# Patient Record
Sex: Male | Born: 1960 | Race: White | Hispanic: No | Marital: Married | Smoking: Never smoker
Health system: Southern US, Community
[De-identification: ages and names within clinical notes are randomized; demographics above are authoritative.]

## PROBLEM LIST (undated history)

## (undated) DIAGNOSIS — G4730 Sleep apnea, unspecified: Secondary | ICD-10-CM

---

## 2013-11-14 HISTORY — PX: OTHER SURGICAL HISTORY: SHX169

## 2013-11-18 ENCOUNTER — Other Ambulatory Visit: Payer: Self-pay | Admitting: Neurosurgery

## 2013-11-18 ENCOUNTER — Encounter (HOSPITAL_COMMUNITY): Payer: Self-pay

## 2013-11-21 ENCOUNTER — Other Ambulatory Visit (HOSPITAL_COMMUNITY): Payer: Self-pay | Admitting: Neurosurgery

## 2013-11-21 ENCOUNTER — Other Ambulatory Visit: Payer: Self-pay | Admitting: Neurosurgery

## 2013-11-21 DIAGNOSIS — D496 Neoplasm of unspecified behavior of brain: Secondary | ICD-10-CM

## 2013-11-26 ENCOUNTER — Inpatient Hospital Stay (HOSPITAL_COMMUNITY): Payer: BC Managed Care – PPO

## 2013-11-26 ENCOUNTER — Inpatient Hospital Stay (HOSPITAL_COMMUNITY)
Admission: RE | Admit: 2013-11-26 | Discharge: 2014-01-12 | DRG: 025 | Disposition: E | Payer: BC Managed Care – PPO | Source: Ambulatory Visit | Attending: Neurosurgery | Admitting: Neurosurgery

## 2013-11-26 DIAGNOSIS — E870 Hyperosmolality and hypernatremia: Secondary | ICD-10-CM | POA: Diagnosis not present

## 2013-11-26 DIAGNOSIS — Z66 Do not resuscitate: Secondary | ICD-10-CM | POA: Diagnosis present

## 2013-11-26 DIAGNOSIS — E876 Hypokalemia: Secondary | ICD-10-CM | POA: Diagnosis not present

## 2013-11-26 DIAGNOSIS — A419 Sepsis, unspecified organism: Secondary | ICD-10-CM | POA: Diagnosis not present

## 2013-11-26 DIAGNOSIS — Z515 Encounter for palliative care: Secondary | ICD-10-CM

## 2013-11-26 DIAGNOSIS — R531 Weakness: Secondary | ICD-10-CM

## 2013-11-26 DIAGNOSIS — R4182 Altered mental status, unspecified: Secondary | ICD-10-CM

## 2013-11-26 DIAGNOSIS — E162 Hypoglycemia, unspecified: Secondary | ICD-10-CM | POA: Diagnosis not present

## 2013-11-26 DIAGNOSIS — G9340 Encephalopathy, unspecified: Secondary | ICD-10-CM

## 2013-11-26 DIAGNOSIS — J189 Pneumonia, unspecified organism: Secondary | ICD-10-CM | POA: Diagnosis not present

## 2013-11-26 DIAGNOSIS — E871 Hypo-osmolality and hyponatremia: Secondary | ICD-10-CM | POA: Diagnosis not present

## 2013-11-26 DIAGNOSIS — I498 Other specified cardiac arrhythmias: Secondary | ICD-10-CM | POA: Diagnosis not present

## 2013-11-26 DIAGNOSIS — H02409 Unspecified ptosis of unspecified eyelid: Secondary | ICD-10-CM | POA: Diagnosis present

## 2013-11-26 DIAGNOSIS — I6350 Cerebral infarction due to unspecified occlusion or stenosis of unspecified cerebral artery: Secondary | ICD-10-CM | POA: Diagnosis present

## 2013-11-26 DIAGNOSIS — H4900 Third [oculomotor] nerve palsy, unspecified eye: Secondary | ICD-10-CM | POA: Diagnosis not present

## 2013-11-26 DIAGNOSIS — Z96659 Presence of unspecified artificial knee joint: Secondary | ICD-10-CM

## 2013-11-26 DIAGNOSIS — C715 Malignant neoplasm of cerebral ventricle: Principal | ICD-10-CM | POA: Diagnosis present

## 2013-11-26 DIAGNOSIS — R358 Other polyuria: Secondary | ICD-10-CM

## 2013-11-26 DIAGNOSIS — I2699 Other pulmonary embolism without acute cor pulmonale: Secondary | ICD-10-CM

## 2013-11-26 DIAGNOSIS — G911 Obstructive hydrocephalus: Secondary | ICD-10-CM | POA: Diagnosis not present

## 2013-11-26 DIAGNOSIS — T380X5A Adverse effect of glucocorticoids and synthetic analogues, initial encounter: Secondary | ICD-10-CM | POA: Diagnosis not present

## 2013-11-26 DIAGNOSIS — R739 Hyperglycemia, unspecified: Secondary | ICD-10-CM

## 2013-11-26 DIAGNOSIS — H519 Unspecified disorder of binocular movement: Secondary | ICD-10-CM | POA: Diagnosis not present

## 2013-11-26 DIAGNOSIS — R3589 Other polyuria: Secondary | ICD-10-CM

## 2013-11-26 DIAGNOSIS — D649 Anemia, unspecified: Secondary | ICD-10-CM | POA: Diagnosis present

## 2013-11-26 DIAGNOSIS — D496 Neoplasm of unspecified behavior of brain: Secondary | ICD-10-CM | POA: Diagnosis present

## 2013-11-26 DIAGNOSIS — G4733 Obstructive sleep apnea (adult) (pediatric): Secondary | ICD-10-CM | POA: Diagnosis present

## 2013-11-26 DIAGNOSIS — I2692 Saddle embolus of pulmonary artery without acute cor pulmonale: Secondary | ICD-10-CM | POA: Diagnosis not present

## 2013-11-26 DIAGNOSIS — R4020 Unspecified coma: Secondary | ICD-10-CM | POA: Diagnosis not present

## 2013-11-26 DIAGNOSIS — G8190 Hemiplegia, unspecified affecting unspecified side: Secondary | ICD-10-CM | POA: Diagnosis present

## 2013-11-26 DIAGNOSIS — J9600 Acute respiratory failure, unspecified whether with hypoxia or hypercapnia: Secondary | ICD-10-CM | POA: Diagnosis not present

## 2013-11-26 DIAGNOSIS — R471 Dysarthria and anarthria: Secondary | ICD-10-CM | POA: Diagnosis present

## 2013-11-26 DIAGNOSIS — R1310 Dysphagia, unspecified: Secondary | ICD-10-CM | POA: Diagnosis not present

## 2013-11-26 HISTORY — DX: Sleep apnea, unspecified: G47.30

## 2013-11-26 LAB — BASIC METABOLIC PANEL
BUN: 17 mg/dL (ref 6–23)
CALCIUM: 8.8 mg/dL (ref 8.4–10.5)
CO2: 25 meq/L (ref 19–32)
Chloride: 104 mEq/L (ref 96–112)
Creatinine, Ser: 1.01 mg/dL (ref 0.50–1.35)
GFR calc Af Amer: >90 mL/min (ref >90)
GFR calc non Af Amer: 84 mL/min — ABNORMAL LOW (ref >90)
GLUCOSE: 141 mg/dL — AB (ref 70–99)
POTASSIUM: 4.2 meq/L (ref 3.7–5.3)
Sodium: 141 mEq/L (ref 137–147)

## 2013-11-26 LAB — CBC
HCT: 37.1 % — ABNORMAL LOW (ref 39.0–52.0)
Hemoglobin: 12.7 g/dL — ABNORMAL LOW (ref 13.0–17.0)
MCH: 28.5 pg (ref 26.0–34.0)
MCHC: 34.2 g/dL (ref 30.0–36.0)
MCV: 83.2 fL (ref 78.0–100.0)
PLATELETS: 323 10*3/uL (ref 150–400)
RBC: 4.46 MIL/uL (ref 4.22–5.81)
RDW: 13.7 % (ref 11.5–15.5)
WBC: 9.2 10*3/uL (ref 4.0–10.5)

## 2013-11-26 LAB — APTT: aPTT: 29 seconds (ref 24–37)

## 2013-11-26 LAB — PROTIME-INR
INR: 0.97 (ref 0.00–1.49)
Prothrombin Time: 12.7 seconds (ref 11.6–15.2)

## 2013-11-26 MED ORDER — OMEGA-3-ACID ETHYL ESTERS 1 G PO CAPS
1.0000 g | ORAL_CAPSULE | Freq: Every day | ORAL | Status: DC
  Administered 2013-11-26: 1 g via ORAL
  Filled 2013-11-26 (×4): qty 1

## 2013-11-26 MED ORDER — SENNA 8.6 MG PO TABS
1.0000 | ORAL_TABLET | Freq: Two times a day (BID) | ORAL | Status: DC
  Administered 2013-11-26 – 2013-11-28 (×2): 8.6 mg via ORAL
  Filled 2013-11-26 (×7): qty 1

## 2013-11-26 MED ORDER — SODIUM CHLORIDE 0.9 % IV SOLN
INTRAVENOUS | Status: DC
  Administered 2013-11-26: 18:00:00 via INTRAVENOUS

## 2013-11-26 MED ORDER — GLUCOSAMINE-CHONDROITIN-MSM PO TABS: ORAL_TABLET | Freq: Every day | ORAL | Status: DC

## 2013-11-26 MED ORDER — GADOBENATE DIMEGLUMINE 529 MG/ML IV SOLN
20.0000 mL | Freq: Once | INTRAVENOUS | Status: AC
  Administered 2013-11-26: 20 mL via INTRAVENOUS

## 2013-11-26 MED ORDER — CHOLECALCIFEROL 10 MCG (400 UNIT) PO TABS
400.0000 [IU] | ORAL_TABLET | Freq: Every day | ORAL | Status: DC
  Administered 2013-11-26 – 2013-12-16 (×16): 400 [IU] via ORAL
  Filled 2013-11-26 (×21): qty 1

## 2013-11-26 MED ORDER — OXYCODONE-ACETAMINOPHEN 5-325 MG PO TABS
1.0000 | ORAL_TABLET | ORAL | Status: DC | PRN
  Administered 2013-11-26: 2 via ORAL
  Filled 2013-11-26 (×2): qty 2

## 2013-11-26 MED ORDER — MORPHINE SULFATE 2 MG/ML IJ SOLN
2.0000 mg | INTRAMUSCULAR | Status: DC | PRN
  Administered 2013-11-26: 2 mg via INTRAVENOUS
  Filled 2013-11-26: qty 1

## 2013-11-26 MED ORDER — CEFAZOLIN SODIUM-DEXTROSE 2-3 GM-% IV SOLR
2.0000 g | INTRAVENOUS | Status: AC
  Administered 2013-11-27 (×3): 2 g via INTRAVENOUS

## 2013-11-26 MED ORDER — OMEGA 3 1200 MG PO CAPS: 1200.0000 mg | ORAL_CAPSULE | Freq: Every day | ORAL | Status: DC

## 2013-11-26 MED ORDER — DOCUSATE SODIUM 100 MG PO CAPS
100.0000 mg | ORAL_CAPSULE | Freq: Two times a day (BID) | ORAL | Status: DC
  Administered 2013-11-26: 100 mg via ORAL
  Filled 2013-11-26 (×11): qty 1

## 2013-11-26 NOTE — H&P (Signed)
HISTORY OF PRESENT ILLNESS: 1.  brain tumor   Danny Suarez is a 53 year old man I initially saw for the first time in the office.  He came in for discussion after having an abnormal MRI and CT scan of the brain.  He is approximately two-week status post left sided knee replacement, however upon questioning he states that his wife noticed that over the last several weeks he has been acting a little bit peculiar.  She says that she noted he was saying things which didn't make sense.  Upon questioning, he also admits to having some vague memory problems.  He also does admit to feeling generally fatigued for a few weeks prior to his surgery.  He denies any significant headaches, although he does have a history of migraine headaches.  He also denies any visual changes, numbness, tingling, or weakness of the extremities.  He denies any nipple discharge, heat or cold intolerance, or sexual dysfunction.     PAST MEDICAL/SURGICAL HISTORY  (Detailed)  Disease/disorder Onset Date Management Date Comments    Vasectomy 1982       PAST MEDICAL HISTORY, SURGICAL HISTORY, FAMILY HISTORY, SOCIAL HISTORY AND REVIEW OF SYSTEMS I have reviewed the patient's past medical, surgical, family and social history as well as the comprehensive review of systems as included on the [facility] history form dated 11/18/2013, which I have signed.  Family History  (Detailed)  Relationship Family Member Name Deceased Age at Death Condition Onset Age Cause of Death      Family history of Hypertension  N      Family history of Diabetes mellitus  N   SOCIAL HISTORY  (Detailed) Tobacco use reviewed. Preferred language is Unknown.   Smoking status: Never smoker.  SMOKING STATUS Use Status Type Smoking Status Usage Per Day Years Used Total Pack Years  no/never  Never smoker             MEDICATIONS(added, continued or stopped this visit):   Medication Dose Prescribed Else Ind Started  Stopped  biotin UNKNOWN Y    Celebrex 200 mg capsule 200 mg Y    cholecalciferol (vitamin D3) 5,000 unit capsule 5,000 unit Y    Coumadin 5 mg tablet 5 mg Y    fish oil 1200 ORAL TABLET 1200 Y    Glucosamine-Chondroitin-MSM 500 mg-300 mg-400 mg-10 mg tablet 500 mg-300 mg-400 mg-10 mg-1.6 mg Y       ALLERGIES:  Ingredient Reaction Medication Name Comment  ARMODAFINIL  Nuvigil Unknown reaction  MELOXICAM  Mobic Unknown reaction  PREDNISONE   Unknown reaction  New allergies added during this encounter. Active list above.   Vitals Date Temp F BP Pulse Ht In Wt Lb BMI BSA Pain Score  11/18/2013  147/90 69 76 310 37.73  0/10   Today: BP 100/69  Pulse 72  Temp(Src) 97.8 F (36.6 C) (Oral)  Resp 20  Ht 6\' 4"  (1.93 m)  Wt 135.626 kg (299 lb)  BMI 36.41 kg/m2  SpO2 98%   PHYSICAL EXAM General General Appearance: normal Mood/Affect: normal Orientation: normal Pulses/Edema: 2+ bilateral radial / DP pulses Gait/Station: in wheelchair s/p L TKA Coordination: normal    Skin Right Upper Extremity: normal Left Upper Extremity: normal Right Lower Extremity: normal Left Lower Extremity: normal  Inspection/Palpation   Right Left  Lumbar Spine: normal normal Upper Extremity: normal normal Lower Extremity: normal normal  Stability Cervical Spine: normal Right Upper Extremity: normal Left Upper Extremity: normal Right Lower Extremity: normal  Left Lower Extremity: normal  Range of Motion Cervical Spine: normal Right Upper Extremity: normal Left Upper Extremity: normal Right Lower Extremity: normal Left Lower Extremity: restricted hip flexion, restricted knee extension, restricted knee flexion  Motor Strength Upper and lower extremity motor strength was tested in the clinically pertinent muscles .Any abnormal findings will be noted below..   Right Left Deltoid: normal normal Biceps: normal normal Triceps: normal normal Infraspinatus: normal normal Wrist  Extensor: normal normal Grip: normal normal Hip Flexor: normal  Knee Extensor: normal  Tib Anterior: normal normal EHL: normal normal Medial Gastroc: normal normal  Sensory Sensation was tested at C5 to T1 and L2 to S1 .Any abnormal findings will be noted below..  Right Left C5: normal normal  C6: normal normal C7: normal normal C8: normal normal T1: normal normal Median Hand: normal normal   Ulnar Hand: normal normal   L2: normal normal  L3: normal normal  L4: normal normal  L5: normal normal  S1: normal normal  Motor and other Tests    Right Left Hoffman's: absent absent Babinski: downgoing downgoing  Muscle Stretch Reflexes Upper and lower extremity reflexes were tested in the clinically pertinent muscles .Any abnormal findings will be noted below..  Right Left Bicep: normal normal Brachioradialis: normal normal Patellar: normal normal Achilles: normal normal   Additional Findings:  Awake. alert, oriented Speech fluent, appropriate Memory, concentration intact CN Exam: II: Acuity grossly normal, visual fields full to confrontation testing III, IV, VI: EOMI V: Facial sensation grossly intact to LT VII: Face symetric VIII: Hearing intact to finger rub IX: uvula midline X: palate elevates symetrically XI: shoulder shrug intact XII: tongue protrusion midline     DIAGNOSTIC RESULTS CT scan of the brain demonstrates a heterogeneous suprasellar lesion with left-sided eccentric calcifications.  No hydrocephalus is seen.  MRI of the brain with and without contrast demonstrates a heterogeneously enhancing anterior inferior third ventricular lesion with possible infiltration into bilateral hypothalamus.  No significant thalamic or hypothalamic peritumoral edema is seen.  There is no hydrocephalus.    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  PLAN - Full serum endocrine workup, check CBC,  BMP, coags - MRI of the brain with contrast with stereotactic protocol - Stop Lovenox - NPO after MN, plan on surgery for biopsy/resection tomorrow  I spent approximately 30 minutes in the office and another 45 minutes today with the patient, his wife, and brother and sister-in-law reviewing his MRI findings and discussing possible diagnoses and treatment options.  I explained to him that at this time, his diagnosis is unclear, however it is concerning for a possible primary brain tumor versus craniopharyngioma.  Given the lack of diagnosis, I told him that he would require surgery for biopsy and possible tumor debulking.  The potential risks of surgery were discussed in detail which include thalamic and hypothalamic dysfunction leading to endocrine disorder, weakness or paralysis, and postoperative coma.  I also discussed the risks of bleeding, infection, and stroke, as well as the risks of anesthesia which include heart attack, stroke, and DVT/PE.  The patient and his family understood our discussion, and agreed to proceed with surgery, as well as the preoperative workup as outlined above.

## 2013-11-27 ENCOUNTER — Encounter (HOSPITAL_COMMUNITY): Payer: BC Managed Care – PPO | Admitting: Certified Registered Nurse Anesthetist

## 2013-11-27 ENCOUNTER — Inpatient Hospital Stay (HOSPITAL_COMMUNITY): Payer: BC Managed Care – PPO | Admitting: Certified Registered Nurse Anesthetist

## 2013-11-27 ENCOUNTER — Encounter (HOSPITAL_COMMUNITY): Admission: RE | Disposition: E | Payer: Self-pay | Source: Ambulatory Visit | Attending: Neurosurgery

## 2013-11-27 HISTORY — PX: CRANIOTOMY: SHX93

## 2013-11-27 LAB — PROLACTIN: PROLACTIN: 21.8 ng/mL — AB (ref 2.1–17.1)

## 2013-11-27 LAB — POCT I-STAT 7, (LYTES, BLD GAS, ICA,H+H)
ACID-BASE EXCESS: 2 mmol/L (ref 0.0–2.0)
Bicarbonate: 26.8 mEq/L — ABNORMAL HIGH (ref 20.0–24.0)
Calcium, Ion: 1.15 mmol/L (ref 1.12–1.23)
HCT: 35 % — ABNORMAL LOW (ref 39.0–52.0)
Hemoglobin: 11.9 g/dL — ABNORMAL LOW (ref 13.0–17.0)
O2 SAT: 100 %
PO2 ART: 177 mmHg — AB (ref 80.0–100.0)
Patient temperature: 36.4
Potassium: 4.3 mEq/L (ref 3.7–5.3)
Sodium: 140 mEq/L (ref 137–147)
TCO2: 28 mmol/L (ref 0–100)
pCO2 arterial: 41.6 mmHg (ref 35.0–45.0)
pH, Arterial: 7.415 (ref 7.350–7.450)

## 2013-11-27 LAB — LUTEINIZING HORMONE: LH: <0.1 m[IU]/mL — ABNORMAL LOW (ref 1.5–9.3)

## 2013-11-27 LAB — GROWTH HORMONE: Growth Hormone: <0.1 ng/mL (ref 0.00–3.00)

## 2013-11-27 LAB — FOLLICLE STIMULATING HORMONE: FSH: 0.4 m[IU]/mL — ABNORMAL LOW (ref 1.4–18.1)

## 2013-11-27 LAB — TESTOSTERONE: TESTOSTERONE: 25 ng/dL — AB (ref 300–890)

## 2013-11-27 LAB — TSH: TSH: 2.72 u[IU]/mL (ref 0.350–4.500)

## 2013-11-27 LAB — ACTH: C206 ACTH: 8 pg/mL — AB (ref 10–46)

## 2013-11-27 LAB — INSULIN-LIKE GROWTH FACTOR: Somatomedin C: 117 ng/mL (ref 55–213)

## 2013-11-27 LAB — T4, FREE: Free T4: 0.76 ng/dL — ABNORMAL LOW (ref 0.80–1.80)

## 2013-11-27 SURGERY — CRANIOTOMY TUMOR EXCISION
Anesthesia: General | Site: Head | Laterality: Right

## 2013-11-27 MED ORDER — 0.9 % SODIUM CHLORIDE (POUR BTL) OPTIME
TOPICAL | Status: DC | PRN
  Administered 2013-11-27 (×3): 1000 mL

## 2013-11-27 MED ORDER — DEXAMETHASONE SODIUM PHOSPHATE 4 MG/ML IJ SOLN
4.0000 mg | Freq: Four times a day (QID) | INTRAMUSCULAR | Status: AC
  Administered 2013-11-28 – 2013-11-29 (×4): 4 mg via INTRAVENOUS
  Filled 2013-11-27 (×4): qty 1

## 2013-11-27 MED ORDER — OXYCODONE HCL 5 MG PO TABS: 5.0000 mg | ORAL_TABLET | Freq: Once | ORAL | Status: DC | PRN

## 2013-11-27 MED ORDER — SODIUM CHLORIDE 0.9 % IV SOLN
INTRAVENOUS | Status: DC
  Administered 2013-11-28: 75 mL/h via INTRAVENOUS
  Administered 2013-11-28: 12:00:00 via INTRAVENOUS
  Administered 2013-11-29: 75 mL/h via INTRAVENOUS
  Administered 2013-11-29 – 2013-12-01 (×3): via INTRAVENOUS

## 2013-11-27 MED ORDER — ONDANSETRON HCL 4 MG/2ML IJ SOLN: 4.0000 mg | Freq: Once | INTRAMUSCULAR | Status: DC | PRN

## 2013-11-27 MED ORDER — LABETALOL HCL 5 MG/ML IV SOLN: 10.0000 mg | INTRAVENOUS | Status: DC | PRN

## 2013-11-27 MED ORDER — LACTATED RINGERS IV SOLN
INTRAVENOUS | Status: DC | PRN
  Administered 2013-11-27 (×2): via INTRAVENOUS

## 2013-11-27 MED ORDER — ROCURONIUM BROMIDE 100 MG/10ML IV SOLN
INTRAVENOUS | Status: DC | PRN
  Administered 2013-11-27: 20 mg via INTRAVENOUS
  Administered 2013-11-27 (×3): 10 mg via INTRAVENOUS
  Administered 2013-11-27 (×2): 20 mg via INTRAVENOUS
  Administered 2013-11-27 (×2): 10 mg via INTRAVENOUS
  Administered 2013-11-27: 50 mg via INTRAVENOUS
  Administered 2013-11-27 (×3): 20 mg via INTRAVENOUS
  Administered 2013-11-27: 10 mg via INTRAVENOUS
  Administered 2013-11-27: 20 mg via INTRAVENOUS

## 2013-11-27 MED ORDER — PROPOFOL 10 MG/ML IV BOLUS
INTRAVENOUS | Status: DC | PRN
  Administered 2013-11-27: 150 mg via INTRAVENOUS

## 2013-11-27 MED ORDER — SODIUM CHLORIDE 0.9 % IV SOLN
1000.0000 mg | INTRAVENOUS | Status: DC | PRN
  Administered 2013-11-27: 1000 mg via INTRAVENOUS

## 2013-11-27 MED ORDER — MANNITOL 25 % IV SOLN
INTRAVENOUS | Status: DC | PRN
  Administered 2013-11-27: 25 g via INTRAVENOUS

## 2013-11-27 MED ORDER — PANTOPRAZOLE SODIUM 40 MG IV SOLR
40.0000 mg | Freq: Every day | INTRAVENOUS | Status: DC
  Administered 2013-11-28 – 2013-12-11 (×14): 40 mg via INTRAVENOUS
  Filled 2013-11-27 (×14): qty 40

## 2013-11-27 MED ORDER — THROMBIN 20000 UNITS EX SOLR
CUTANEOUS | Status: DC | PRN
  Administered 2013-11-27 (×2): via TOPICAL

## 2013-11-27 MED ORDER — SODIUM CHLORIDE 0.9 % IV SOLN
1000.0000 mg | INTRAVENOUS | Status: DC
  Filled 2013-11-27 (×2): qty 10

## 2013-11-27 MED ORDER — FENTANYL CITRATE 0.05 MG/ML IJ SOLN
INTRAMUSCULAR | Status: DC | PRN
  Administered 2013-11-27: 50 ug via INTRAVENOUS
  Administered 2013-11-27 (×2): 100 ug via INTRAVENOUS
  Administered 2013-11-27 (×6): 50 ug via INTRAVENOUS
  Administered 2013-11-27: 100 ug via INTRAVENOUS
  Administered 2013-11-27 (×3): 50 ug via INTRAVENOUS
  Administered 2013-11-27 (×2): 100 ug via INTRAVENOUS
  Administered 2013-11-27 (×2): 50 ug via INTRAVENOUS
  Administered 2013-11-27: 200 ug via INTRAVENOUS
  Administered 2013-11-27 (×4): 50 ug via INTRAVENOUS

## 2013-11-27 MED ORDER — THROMBIN 5000 UNITS EX SOLR
OROMUCOSAL | Status: DC | PRN
  Administered 2013-11-27: 14:00:00 via TOPICAL

## 2013-11-27 MED ORDER — CEFAZOLIN SODIUM-DEXTROSE 2-3 GM-% IV SOLR
INTRAVENOUS | Status: AC
  Filled 2013-11-27: qty 50

## 2013-11-27 MED ORDER — DEXAMETHASONE SODIUM PHOSPHATE 4 MG/ML IJ SOLN
INTRAMUSCULAR | Status: DC | PRN
  Administered 2013-11-27: 10 mg via INTRAVENOUS
  Administered 2013-11-27: 6 mg via INTRAVENOUS

## 2013-11-27 MED ORDER — VECURONIUM BROMIDE 10 MG IV SOLR
INTRAVENOUS | Status: DC | PRN
  Administered 2013-11-27 (×3): 2 mg via INTRAVENOUS
  Administered 2013-11-27: 1 mg via INTRAVENOUS
  Administered 2013-11-27 (×5): 2 mg via INTRAVENOUS
  Administered 2013-11-27: 3 mg via INTRAVENOUS

## 2013-11-27 MED ORDER — CEFAZOLIN SODIUM 1-5 GM-% IV SOLN
INTRAVENOUS | Status: AC
  Filled 2013-11-27: qty 50

## 2013-11-27 MED ORDER — PROPOFOL 10 MG/ML IV EMUL
5.0000 ug/kg/min | INTRAVENOUS | Status: DC
  Administered 2013-11-28: 40 ug/kg/min via INTRAVENOUS
  Administered 2013-11-28 (×2): 20 ug/kg/min via INTRAVENOUS
  Filled 2013-11-27: qty 100

## 2013-11-27 MED ORDER — SODIUM CHLORIDE 0.9 % IV SOLN
INTRAVENOUS | Status: DC | PRN
  Administered 2013-11-27 (×2): via INTRAVENOUS

## 2013-11-27 MED ORDER — PANTOPRAZOLE SODIUM 40 MG IV SOLR: 40.0000 mg | Freq: Every day | INTRAVENOUS | Status: DC

## 2013-11-27 MED ORDER — ONDANSETRON HCL 4 MG/2ML IJ SOLN: 4.0000 mg | INTRAMUSCULAR | Status: DC | PRN

## 2013-11-27 MED ORDER — DEXAMETHASONE SODIUM PHOSPHATE 4 MG/ML IJ SOLN
4.0000 mg | Freq: Three times a day (TID) | INTRAMUSCULAR | Status: DC
  Administered 2013-11-30 – 2013-12-07 (×23): 4 mg via INTRAVENOUS
  Filled 2013-11-27 (×26): qty 1

## 2013-11-27 MED ORDER — BUPIVACAINE HCL (PF) 0.5 % IJ SOLN
INTRAMUSCULAR | Status: DC | PRN
  Administered 2013-11-27: 7 mL

## 2013-11-27 MED ORDER — SODIUM CHLORIDE 0.9 % IV SOLN
500.0000 mg | Freq: Two times a day (BID) | INTRAVENOUS | Status: DC
  Administered 2013-11-28 – 2013-12-21 (×49): 500 mg via INTRAVENOUS
  Filled 2013-11-27 (×51): qty 5

## 2013-11-27 MED ORDER — PROMETHAZINE HCL 25 MG PO TABS: 12.5000 mg | ORAL_TABLET | ORAL | Status: DC | PRN

## 2013-11-27 MED ORDER — DEXAMETHASONE SODIUM PHOSPHATE 10 MG/ML IJ SOLN
6.0000 mg | Freq: Four times a day (QID) | INTRAMUSCULAR | Status: AC
  Administered 2013-11-28 (×4): 6 mg via INTRAVENOUS
  Filled 2013-11-27: qty 1
  Filled 2013-11-27 (×2): qty 0.6
  Filled 2013-11-27 (×2): qty 1

## 2013-11-27 MED ORDER — OXYCODONE HCL 5 MG/5ML PO SOLN: 5.0000 mg | Freq: Once | ORAL | Status: DC | PRN

## 2013-11-27 MED ORDER — LACTATED RINGERS IV SOLN
INTRAVENOUS | Status: DC | PRN
  Administered 2013-11-27 (×2): via INTRAVENOUS

## 2013-11-27 MED ORDER — MIDAZOLAM HCL 5 MG/5ML IJ SOLN
INTRAMUSCULAR | Status: DC | PRN
  Administered 2013-11-27 (×3): 1 mg via INTRAVENOUS

## 2013-11-27 MED ORDER — BIOTENE DRY MOUTH MT LIQD
15.0000 mL | Freq: Four times a day (QID) | OROMUCOSAL | Status: DC
  Administered 2013-11-28 – 2013-12-10 (×48): 15 mL via OROMUCOSAL

## 2013-11-27 MED ORDER — CHLORHEXIDINE GLUCONATE 0.12 % MT SOLN
15.0000 mL | Freq: Two times a day (BID) | OROMUCOSAL | Status: DC
  Administered 2013-11-28 – 2013-12-03 (×12): 15 mL via OROMUCOSAL
  Filled 2013-11-27 (×13): qty 15

## 2013-11-27 MED ORDER — CEFAZOLIN SODIUM 1-5 GM-% IV SOLN
INTRAVENOUS | Status: AC
  Filled 2013-11-27: qty 100

## 2013-11-27 MED ORDER — ONDANSETRON HCL 4 MG PO TABS: 4.0000 mg | ORAL_TABLET | ORAL | Status: DC | PRN

## 2013-11-27 MED ORDER — CEFAZOLIN SODIUM-DEXTROSE 2-3 GM-% IV SOLR
2.0000 g | Freq: Three times a day (TID) | INTRAVENOUS | Status: AC
  Administered 2013-11-28 (×2): 2 g via INTRAVENOUS
  Filled 2013-11-27 (×2): qty 50

## 2013-11-27 MED ORDER — BACITRACIN ZINC 500 UNIT/GM EX OINT
TOPICAL_OINTMENT | CUTANEOUS | Status: DC | PRN
  Administered 2013-11-27: 1 via TOPICAL

## 2013-11-27 MED ORDER — HYDROMORPHONE HCL PF 1 MG/ML IJ SOLN: 0.2500 mg | INTRAMUSCULAR | Status: DC | PRN

## 2013-11-27 MED ORDER — LIDOCAINE-EPINEPHRINE 1 %-1:100000 IJ SOLN
INTRAMUSCULAR | Status: DC | PRN
  Administered 2013-11-27: 7 mL

## 2013-11-27 MED ORDER — PROPOFOL INFUSION 10 MG/ML OPTIME
INTRAVENOUS | Status: DC | PRN
  Administered 2013-11-27: 50 ug/kg/min via INTRAVENOUS

## 2013-11-27 SURGICAL SUPPLY — 120 items
BANDAGE GAUZE 4  KLING STR (GAUZE/BANDAGES/DRESSINGS) ×6 IMPLANT
BANDAGE GAUZE ELAST BULKY 4 IN (GAUZE/BANDAGES/DRESSINGS) ×6 IMPLANT
BENZOIN TINCTURE PRP APPL 2/3 (GAUZE/BANDAGES/DRESSINGS) IMPLANT
BLADE EYE SICKLE 84 5 BEAV (BLADE) ×2 IMPLANT
BLADE EYE SICKLE 84 5MM BEAV (BLADE) ×1
BLADE SAW GIGLI 16 STRL (MISCELLANEOUS) IMPLANT
BLADE SURG 15 STRL LF DISP TIS (BLADE) IMPLANT
BLADE SURG 15 STRL SS (BLADE)
BLADE SURG ROTATE 9660 (MISCELLANEOUS) ×3 IMPLANT
BLADE ULTRA TIP 2M (BLADE) ×3 IMPLANT
BRUSH SCRUB EZ 1% IODOPHOR (MISCELLANEOUS) IMPLANT
BUR ACORN 6.0 PRECISION (BURR) ×2 IMPLANT
BUR ACORN 6.0MM PRECISION (BURR) ×1
BUR ADDG 1.1 (BURR) IMPLANT
BUR ADDG 1.1MM (BURR)
BUR MATCHSTICK NEURO 3.0 LAGG (BURR) IMPLANT
BUR ROUND FLUTED 4 SOFT TCH (BURR) ×2 IMPLANT
BUR ROUND FLUTED 4MM SOFT TCH (BURR) ×1
BUR ROUTER D-58 CRANI (BURR) ×3 IMPLANT
CANISTER SUCT 3000ML (MISCELLANEOUS) ×6 IMPLANT
CATH VENTRIC 35X38 W/TROCAR LG (CATHETERS) IMPLANT
CLIP TI MEDIUM 6 (CLIP) IMPLANT
CONT SPEC 4OZ CLIKSEAL STRL BL (MISCELLANEOUS) ×12 IMPLANT
CORDS BIPOLAR (ELECTRODE) ×3 IMPLANT
COVER MAYO STAND STRL (DRAPES) ×6 IMPLANT
DECANTER SPIKE VIAL GLASS SM (MISCELLANEOUS) ×3 IMPLANT
DRAIN SNY WOU 7FLT (WOUND CARE) IMPLANT
DRAIN SUBARACHNOID (WOUND CARE) IMPLANT
DRAPE MICROSCOPE LEICA (MISCELLANEOUS) ×3 IMPLANT
DRAPE NEUROLOGICAL W/INCISE (DRAPES) ×3 IMPLANT
DRAPE ORTHO SPLIT 77X108 STRL (DRAPES)
DRAPE PROXIMA HALF (DRAPES) IMPLANT
DRAPE STERI IOBAN 125X83 (DRAPES) IMPLANT
DRAPE SURG 17X23 STRL (DRAPES) IMPLANT
DRAPE SURG IRRIG POUCH 19X23 (DRAPES) IMPLANT
DRAPE SURG ORHT 6 SPLT 77X108 (DRAPES) IMPLANT
DRAPE WARM FLUID 44X44 (DRAPE) ×3 IMPLANT
DRESSING TELFA 8X3 (GAUZE/BANDAGES/DRESSINGS) IMPLANT
DRSG EMULSION OIL 3X3 NADH (GAUZE/BANDAGES/DRESSINGS) ×6 IMPLANT
DURAFORM SPONGE 2X2 SINGLE (Neuro Prosthesis/Implant) ×6 IMPLANT
DURAPREP 6ML APPLICATOR 50/CS (WOUND CARE) ×3 IMPLANT
DURASEAL APPLICATOR TIP (TIP) ×3 IMPLANT
DURASEAL SPINE SEALANT 3ML (MISCELLANEOUS) ×3 IMPLANT
ELECT CAUTERY BLADE 6.4 (BLADE) IMPLANT
ELECT REM PT RETURN 9FT ADLT (ELECTROSURGICAL) ×3
ELECTRODE REM PT RTRN 9FT ADLT (ELECTROSURGICAL) ×1 IMPLANT
EVACUATOR 1/8 PVC DRAIN (DRAIN) IMPLANT
EVACUATOR SILICONE 100CC (DRAIN) IMPLANT
FORCEPS BIPOLAR SPETZLER 8 1.0 (NEUROSURGERY SUPPLIES) ×3 IMPLANT
GAUZE SPONGE 4X4 16PLY XRAY LF (GAUZE/BANDAGES/DRESSINGS) ×3 IMPLANT
GLOVE BIOGEL PI IND STRL 7.0 (GLOVE) ×4 IMPLANT
GLOVE BIOGEL PI IND STRL 7.5 (GLOVE) ×1 IMPLANT
GLOVE BIOGEL PI INDICATOR 7.0 (GLOVE) ×8
GLOVE BIOGEL PI INDICATOR 7.5 (GLOVE) ×2
GLOVE ECLIPSE 6.5 STRL STRAW (GLOVE) ×6 IMPLANT
GLOVE ECLIPSE 7.0 STRL STRAW (GLOVE) ×6 IMPLANT
GLOVE ECLIPSE 7.5 STRL STRAW (GLOVE) ×6 IMPLANT
GLOVE EXAM NITRILE LRG STRL (GLOVE) IMPLANT
GLOVE EXAM NITRILE MD LF STRL (GLOVE) IMPLANT
GLOVE EXAM NITRILE XL STR (GLOVE) IMPLANT
GLOVE EXAM NITRILE XS STR PU (GLOVE) IMPLANT
GLOVE SS BIOGEL STRL SZ 6.5 (GLOVE) ×4 IMPLANT
GLOVE SUPERSENSE BIOGEL SZ 6.5 (GLOVE) ×8
GOWN BRE IMP SLV AUR LG STRL (GOWN DISPOSABLE) IMPLANT
GOWN BRE IMP SLV AUR XL STRL (GOWN DISPOSABLE) ×3 IMPLANT
GOWN STRL REIN 2XL LVL4 (GOWN DISPOSABLE) IMPLANT
GOWN STRL REUS W/ TWL LRG LVL3 (GOWN DISPOSABLE) ×5 IMPLANT
GOWN STRL REUS W/ TWL XL LVL3 (GOWN DISPOSABLE) ×1 IMPLANT
GOWN STRL REUS W/TWL LRG LVL3 (GOWN DISPOSABLE) ×10
GOWN STRL REUS W/TWL XL LVL3 (GOWN DISPOSABLE) ×2
HEMOSTAT POWDER SURGIFOAM 1G (HEMOSTASIS) ×3 IMPLANT
HEMOSTAT SURGICEL 2X14 (HEMOSTASIS) ×3 IMPLANT
KIT BASIN OR (CUSTOM PROCEDURE TRAY) ×3 IMPLANT
KIT DRAIN CSF ACCUDRAIN (MISCELLANEOUS) IMPLANT
KIT ROOM TURNOVER OR (KITS) ×3 IMPLANT
KNIFE ARACHNOID DISP AM-24-S (MISCELLANEOUS) ×3 IMPLANT
NEEDLE HYPO 25X1 1.5 SAFETY (NEEDLE) ×3 IMPLANT
NEEDLE SPNL 18GX3.5 QUINCKE PK (NEEDLE) IMPLANT
NS IRRIG 1000ML POUR BTL (IV SOLUTION) ×6 IMPLANT
PACK CRANIOTOMY (CUSTOM PROCEDURE TRAY) ×3 IMPLANT
PAD EYE OVAL STERILE LF (GAUZE/BANDAGES/DRESSINGS) IMPLANT
PATTIES SURGICAL .25X.25 (GAUZE/BANDAGES/DRESSINGS) ×6 IMPLANT
PATTIES SURGICAL .5 X.5 (GAUZE/BANDAGES/DRESSINGS) ×3 IMPLANT
PATTIES SURGICAL .5 X3 (DISPOSABLE) IMPLANT
PATTIES SURGICAL 1/4 X 3 (GAUZE/BANDAGES/DRESSINGS) IMPLANT
PATTIES SURGICAL 1X1 (DISPOSABLE) IMPLANT
PLATE 1.5  2HOLE LNG NEURO (Plate) ×8 IMPLANT
PLATE 1.5 2HOLE LNG NEURO (Plate) ×4 IMPLANT
RUBBERBAND STERILE (MISCELLANEOUS) IMPLANT
SCREW SELF DRILL HT 1.5/4MM (Screw) ×24 IMPLANT
SET TUBING W/EXT DISP (INSTRUMENTS) ×3 IMPLANT
SPECIMEN JAR SMALL (MISCELLANEOUS) IMPLANT
SPONGE GAUZE 4X4 12PLY (GAUZE/BANDAGES/DRESSINGS) ×3 IMPLANT
SPONGE NEURO XRAY DETECT 1X3 (DISPOSABLE) IMPLANT
SPONGE SURGIFOAM ABS GEL 100 (HEMOSTASIS) ×6 IMPLANT
STAPLER VISISTAT 35W (STAPLE) ×6 IMPLANT
STOCKINETTE 6  STRL (DRAPES) ×2
STOCKINETTE 6 STRL (DRAPES) ×1 IMPLANT
SUT ETHILON 3 0 FSL (SUTURE) IMPLANT
SUT ETHILON 3 0 PS 1 (SUTURE) IMPLANT
SUT NURALON 4 0 TR CR/8 (SUTURE) ×6 IMPLANT
SUT SILK 0 TIES 10X30 (SUTURE) IMPLANT
SUT VIC AB 0 CT1 18XCR BRD8 (SUTURE) ×2 IMPLANT
SUT VIC AB 0 CT1 8-18 (SUTURE) ×4
SUT VIC AB 2-0 CT2 18 VCP726D (SUTURE) ×6 IMPLANT
SUT VIC AB 3-0 SH 8-18 (SUTURE) ×9 IMPLANT
SYR 20ML ECCENTRIC (SYRINGE) ×3 IMPLANT
SYR CONTROL 10ML LL (SYRINGE) ×3 IMPLANT
TIP NONSTICK .5MMX23CM (INSTRUMENTS) ×2
TIP NONSTICK .5X23 (INSTRUMENTS) ×1 IMPLANT
TIP SONASTAR STD MISONIX 1.9 (TRAY / TRAY PROCEDURE) IMPLANT
TIP STRAIGHT 25KHZ (INSTRUMENTS) ×3 IMPLANT
TOWEL OR 17X24 6PK STRL BLUE (TOWEL DISPOSABLE) ×3 IMPLANT
TOWEL OR 17X26 10 PK STRL BLUE (TOWEL DISPOSABLE) ×3 IMPLANT
TRAY FOLEY CATH 14FRSI W/METER (CATHETERS) IMPLANT
TRAY FOLEY CATH 16FRSI W/METER (SET/KITS/TRAYS/PACK) ×3 IMPLANT
TUBE CONNECTING 12'X1/4 (SUCTIONS) ×1
TUBE CONNECTING 12X1/4 (SUCTIONS) ×2 IMPLANT
UNDERPAD 30X30 INCONTINENT (UNDERPADS AND DIAPERS) IMPLANT
WATER STERILE IRR 1000ML POUR (IV SOLUTION) ×3 IMPLANT

## 2013-11-27 NOTE — Anesthesia Postprocedure Evaluation (Signed)
  Anesthesia Post-op Note  Patient: Danny Suarez  Procedure(s) Performed: Procedure(s) with comments: RIGHT CRANIOTOMY FOR RESECTION/BIOPSY OF TUMOR,ANY INDICATED PROCEDURES (Right) - right  Patient Location: PACU and NICU  Anesthesia Type:General  Level of Consciousness: sedated and Patient remains intubated per anesthesia plan  Airway and Oxygen Therapy: Patient remains intubated per anesthesia plan and Patient placed on Ventilator (see vital sign flow sheet for setting)  Post-op Pain: none  Post-op Assessment: Post-op Vital signs reviewed, Patient's Cardiovascular Status Stable, Respiratory Function Stable, Patent Airway, No signs of Nausea or vomiting and Pain level controlled  Post-op Vital Signs: stable  Complications: No apparent anesthesia complications

## 2013-11-27 NOTE — Transfer of Care (Signed)
Immediate Anesthesia Transfer of Care Note  Patient: Danny Suarez  Procedure(s) Performed: Procedure(s) with comments: RIGHT CRANIOTOMY FOR RESECTION/BIOPSY OF TUMOR,ANY INDICATED PROCEDURES (Right) - right  Patient Location: NICU  Anesthesia Type:General  Level of Consciousness: sedated, unresponsive and Patient remains intubated per anesthesia plan  Airway & Oxygen Therapy: Patient remains intubated per anesthesia plan and Patient placed on Ventilator (see vital sign flow sheet for setting)  Post-op Assessment: Report given to PACU RN and Post -op Vital signs reviewed and stable  Post vital signs: Reviewed and stable  Complications: No apparent anesthesia complications

## 2013-11-27 NOTE — Anesthesia Preprocedure Evaluation (Signed)
Anesthesia Evaluation  Patient identified by MRN, date of birth, ID band Patient awake    Reviewed: Allergy & Precautions, H&P , NPO status , Patient's Chart, lab work & pertinent test results  Airway Mallampati: III TM Distance: >3 FB Neck ROM: Full    Dental  (+) Teeth Intact and Dental Advisory Given   Pulmonary  breath sounds clear to auscultation        Cardiovascular Rhythm:Regular Rate:Normal     Neuro/Psych    GI/Hepatic   Endo/Other    Renal/GU      Musculoskeletal   Abdominal (+) + obese,   Peds  Hematology   Anesthesia Other Findings   Reproductive/Obstetrics                           Anesthesia Physical Anesthesia Plan  ASA: III  Anesthesia Plan: General   Post-op Pain Management:    Induction: Intravenous  Airway Management Planned: Oral ETT  Additional Equipment: Arterial line and CVP  Intra-op Plan:   Post-operative Plan: Extubation in OR  Informed Consent: I have reviewed the patients History and Physical, chart, labs and discussed the procedure including the risks, benefits and alternatives for the proposed anesthesia with the patient or authorized representative who has indicated his/her understanding and acceptance.   Dental advisory given  Plan Discussed with: CRNA and Anesthesiologist  Anesthesia Plan Comments:         Anesthesia Quick Evaluation

## 2013-11-28 ENCOUNTER — Encounter (HOSPITAL_COMMUNITY): Payer: Self-pay | Admitting: Pulmonary Disease

## 2013-11-28 ENCOUNTER — Inpatient Hospital Stay (HOSPITAL_COMMUNITY): Payer: BC Managed Care – PPO

## 2013-11-28 DIAGNOSIS — D496 Neoplasm of unspecified behavior of brain: Secondary | ICD-10-CM

## 2013-11-28 DIAGNOSIS — R3589 Other polyuria: Secondary | ICD-10-CM

## 2013-11-28 DIAGNOSIS — R358 Other polyuria: Secondary | ICD-10-CM

## 2013-11-28 DIAGNOSIS — G9340 Encephalopathy, unspecified: Secondary | ICD-10-CM

## 2013-11-28 LAB — BASIC METABOLIC PANEL
BUN: 17 mg/dL (ref 6–23)
CHLORIDE: 104 meq/L (ref 96–112)
CO2: 26 mEq/L (ref 19–32)
CREATININE: 1 mg/dL (ref 0.50–1.35)
Calcium: 8.6 mg/dL (ref 8.4–10.5)
GFR calc non Af Amer: 85 mL/min — ABNORMAL LOW (ref >90)
Glucose, Bld: 182 mg/dL — ABNORMAL HIGH (ref 70–99)
POTASSIUM: 4.4 meq/L (ref 3.7–5.3)
Sodium: 144 mEq/L (ref 137–147)

## 2013-11-28 LAB — POCT I-STAT 3, ART BLOOD GAS (G3+)
Acid-base deficit: 1 mmol/L (ref 0.0–2.0)
Bicarbonate: 23.2 mEq/L (ref 20.0–24.0)
O2 Saturation: 95 %
PCO2 ART: 35.8 mmHg (ref 35.0–45.0)
PO2 ART: 76 mmHg — AB (ref 80.0–100.0)
Patient temperature: 98.6
TCO2: 24 mmol/L (ref 0–100)
pH, Arterial: 7.419 (ref 7.350–7.450)

## 2013-11-28 LAB — CBC
HEMATOCRIT: 36.9 % — AB (ref 39.0–52.0)
HEMOGLOBIN: 12.3 g/dL — AB (ref 13.0–17.0)
MCH: 28 pg (ref 26.0–34.0)
MCHC: 33.3 g/dL (ref 30.0–36.0)
MCV: 83.9 fL (ref 78.0–100.0)
Platelets: 356 10*3/uL (ref 150–400)
RBC: 4.4 MIL/uL (ref 4.22–5.81)
RDW: 13.7 % (ref 11.5–15.5)
WBC: 16.1 10*3/uL — AB (ref 4.0–10.5)

## 2013-11-28 LAB — SODIUM: SODIUM: 143 meq/L (ref 137–147)

## 2013-11-28 LAB — GLUCOSE, CAPILLARY
GLUCOSE-CAPILLARY: 183 mg/dL — AB (ref 70–99)
Glucose-Capillary: 137 mg/dL — ABNORMAL HIGH (ref 70–99)
Glucose-Capillary: 179 mg/dL — ABNORMAL HIGH (ref 70–99)

## 2013-11-28 LAB — OSMOLALITY, URINE: OSMOLALITY UR: 433 mosm/kg (ref 390–1090)

## 2013-11-28 LAB — TRIGLYCERIDES: Triglycerides: 129 mg/dL (ref <150)

## 2013-11-28 LAB — MRSA PCR SCREENING: MRSA by PCR: NEGATIVE

## 2013-11-28 LAB — SODIUM, URINE, RANDOM: SODIUM UR: 68 meq/L

## 2013-11-28 LAB — CORTISOL-PM, BLOOD: Cortisol - PM: 6 ug/dL (ref 3.1–16.7)

## 2013-11-28 MED ORDER — FENTANYL CITRATE 0.05 MG/ML IJ SOLN: 100.0000 ug | INTRAMUSCULAR | Status: DC | PRN

## 2013-11-28 MED ORDER — ACETAMINOPHEN 10 MG/ML IV SOLN
1000.0000 mg | Freq: Four times a day (QID) | INTRAVENOUS | Status: DC | PRN
  Filled 2013-11-28: qty 100

## 2013-11-28 MED ORDER — PROPOFOL 10 MG/ML IV EMUL
0.0000 ug/kg/min | INTRAVENOUS | Status: DC
  Filled 2013-11-28: qty 100

## 2013-11-28 MED ORDER — ACETAMINOPHEN 160 MG/5ML PO SOLN
650.0000 mg | ORAL | Status: DC | PRN
  Administered 2013-11-28 – 2013-12-24 (×23): 650 mg
  Filled 2013-11-28 (×23): qty 20.3

## 2013-11-28 MED ORDER — VITAL HIGH PROTEIN PO LIQD
1000.0000 mL | ORAL | Status: DC
  Administered 2013-11-28 – 2013-12-01 (×3): 1000 mL
  Filled 2013-11-28 (×7): qty 1000

## 2013-11-28 NOTE — Consult Note (Addendum)
Name: Danny Suarez MRN: [id number] DOB: 04/10/1961    ADMISSION DATE:  11/19/2013 CONSULTATION DATE:  11/28/12  REFERRING MD :  Dr. Kathyrn Sheriff PRIMARY SERVICE: NSGY  CHIEF COMPLAINT:  Resp Failure  BRIEF PATIENT DESCRIPTION: 53 y/o M admitted on 1/13 after abnormal MRI/CT of brain with findings of anterior thrid ventricular tumor.  Underwent R craniotomy for resection / biopsy on 1/14.  Returned to ICU on vent.    SIGNIFICANT EVENTS / STUDIES:  1/13 - Admit for planned craniotomy after abnormal MRI / CT 1/14 - Craniotomy with tumor resection / bx per Dr. Kathyrn Sheriff  LINES / TUBES: OETT 1/15>>>  CULTURES / PATHOLOGY: Tumor Path 1/14>>>  ANTIBIOTICS: Ancef post OR 1/14  HISTORY OF PRESENT ILLNESS:  53 y/o M, with little medical history who was admitted on 1/13 for planned craniotomy.  Patient underwent a left knee replacement approx 2 weeks prior to admit.  Wife noticed post surgery that the patient was exhibiting peculiar behavior and train of thought.  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 He was referred to Dr. Kathyrn Sheriff and was scheduled for tumor resection and biopsy.  Patient underwent biopsy on 1/14 and was returned to ICU on vent post-op.  PCCM consulted for vent management.   PAST MEDICAL HISTORY :  No past medical history on file. Past Surgical History  Procedure Laterality Date  . Left knee replacement  11/2013  . Craniotomy  11/16/2013    Tumor resection    Prior to Admission medications   Medication Sig Start Date End Date Taking? Authorizing Provider  Biotin 1000 MCG tablet Take 1,000 mcg by mouth daily.   Yes Historical Provider, MD  celecoxib (CELEBREX) 200 MG capsule Take 200 mg by mouth daily as needed (pain).   Yes Historical Provider, MD  CHOLECALCIFEROL PO Take 2 tablets by mouth daily.   Yes Historical Provider, MD  GLUCOSAMINE-CHONDROITIN-MSM PO Take 1 tablet by mouth 2 (two) times daily.     Yes Historical Provider, MD  HYDROcodone-acetaminophen (NORCO/VICODIN) 5-325 MG per tablet Take 1 tablet by mouth every 4 (four) hours as needed for moderate pain.   Yes Historical Provider, MD  Omega 3 1200 MG CAPS Take 1,200 mg by mouth daily.   Yes Historical Provider, MD  warfarin (COUMADIN) 5 MG tablet Take 5 mg by mouth daily. 11/14/13 12/05/13 Yes Historical Provider, MD   Allergies  Allergen Reactions  . Mobic [Meloxicam]   . Nuvigil [Armodafinil]   . Prednisolone     FAMILY HISTORY:  No family history on file.  SOCIAL HISTORY:  reports that he has never smoked. He does not have any smokeless tobacco history on file. His alcohol and drug histories are not on file.  REVIEW OF SYSTEMS:  Unable to complete as pt is on vent / sedated.    SUBJECTIVE: RN report increased UOP post surgery, no acute events.   VITAL SIGNS: Temp:  [97.9 F (36.6 C)-100.1 F (37.8 C)] 100.1 F (37.8 C) (01/14 2300) Pulse Rate:  [64-76] 76 (01/14 2300) Resp:  [18-20] 18 (01/14 2300) BP: (113-139)/(52-89) 113/52 mmHg (01/14 2300) SpO2:  [96 %-100 %] 100 % (01/14 2300) Arterial Line BP: (132)/(64) 132/64 mmHg (01/14 2300) FiO2 (%):  [40 %-100 %] 40 % (01/14 2331) Weight:  [308 lb 3.3 oz (139.8 kg)] 308 lb 3.3 oz (139.8 kg) (01/14 2240)  HEMODYNAMICS:    VENTILATOR SETTINGS: Vent Mode:  [-] PRVC FiO2 (%):  [  40 %-100 %] 40 % Set Rate:  [16 bmp] 16 bmp Vt Set:  [600 mL] 600 mL PEEP:  [5 cmH20] 5 cmH20 Plateau Pressure:  [14 cmH20-18 cmH20] 14 cmH20 INTAKE / OUTPUT: Intake/Output     01/14 0701 - 01/15 0700   I.V. (mL/kg) 5017.5 (35.9)   IV Piggyback 155   Total Intake(mL/kg) 5172.5 (37)   Urine (mL/kg/hr) 2920 (0.9)   Blood 340 (0.1)   Total Output 3260   Net +1912.5         PHYSICAL EXAMINATION: General:  wdwn adult male in NAD Neuro:  Sedate on vent, synchronous  HEENT:  OETT, mm pink/moist, no JVD Cardiovascular:  s1s2 rrr, no m/r/g Lungs:  resp's even/non-labored, lungs  bilaterally clear Abdomen:  Round/soft, bsx4 active, OGT  Musculoskeletal:  No acute deformitiy Skin:  Warm/dry, no edema   LABS:  CBC  Recent Labs Lab 11/14/2013 1830 12/05/2013 1300  WBC 9.2  --   HGB 12.7* 11.9*  HCT 37.1* 35.0*  PLT 323  --    Coag's  Recent Labs Lab 12/03/2013 1830  APTT 29  INR 0.97   BMET  Recent Labs Lab 11/25/2013 1830 11/15/2013 1300  NA 141 140  K 4.2 4.3  CL 104  --   CO2 25  --   BUN 17  --   CREATININE 1.01  --   GLUCOSE 141*  --    Electrolytes  Recent Labs Lab 12/09/2013 1830  CALCIUM 8.8   Sepsis Markers No results found for this basename: LATICACIDVEN, PROCALCITON, O2SATVEN,  in the last 168 hours ABG  Recent Labs Lab 11/22/2013 1300 11/28/13 0009  PHART 7.415 7.419  PCO2ART 41.6 35.8  PO2ART 177.0* 76.0*   Liver Enzymes No results found for this basename: AST, ALT, ALKPHOS, BILITOT, ALBUMIN,  in the last 168 hours Cardiac Enzymes No results found for this basename: TROPONINI, PROBNP,  in the last 168 hours Glucose No results found for this basename: GLUCAP,  in the last 168 hours  Imaging Mr Jeri Cos Wo Contrast  12/12/2013   CLINICAL DATA:  Preop surgery for anterior third ventricular mass.  EXAM: MRI HEAD WITHOUT AND WITH CONTRAST  TECHNIQUE: Multiplanar, multiecho pulse sequences of the brain and surrounding structures were obtained without and with intravenous contrast.  CONTRAST:  45mL MULTIHANCE GADOBENATE DIMEGLUMINE 529 MG/ML IV SOLN  COMPARISON:  11/15/2013 MR from [facility].  FINDINGS: Re-demonstrated is a infiltrative mass filling the anterior third ventricle and extending into the adjacent thalamic and hypothalamic structures. Mild surrounding edema and central necrosis. As measured on post contrast T1 weighted sequences, the lesion measures 26 x 26 x 28 mm. Mineralization reflecting CT calcification. No subarachnoid or intraventricular enhancement to suggest distant spread. Mild transependymal  absorption is observed surrounding the lateral ventricles. Similar appearance to most recent prior.  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 Please see discussion from prior MR.   Electronically Signed   By: Rolla Flatten M.D.   On: 12/07/2013 20:59   Portable Chest Xray  11/28/2013   CLINICAL DATA:  Check endotracheal tube  EXAM: PORTABLE CHEST - 1 VIEW  COMPARISON:  None.  FINDINGS: Endotracheal tube ends in the mid thoracic trachea. Right IJ catheter at the level of the upper cavoatrial junction. Orogastric tube is coiled in to the stomach, tip residing in the fundus.  Low volume lungs, likely accounting for perihilar opacities. No edema or effusion. No pneumothorax.  IMPRESSION: 1. Tubes and  lines are in good position, as above. 2. Low volume lungs with perihilar opacities presumably representing atelectasis.   Electronically Signed   By: Jorje Guild M.D.   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 - pathology pending  R/O DI  Sedation  P:   -rec' s per NSGY -send urine osmol / sodium -decadron taper  -keppra -propofol for sedation   PULMONARY A: Acute Respiratory Failure P:   -full vent support overnight -SBT/WUA in am -f/u cxr in am  CARDIOVASCULAR A:  Normotension  P:  -ICU monitoring -gentle hydration, NS @75   -PRN labetalol  RENAL A:   No acute issues  P:   -monitor renal fxn -repeat BMP now with increased UOP  -urine studies as above  GASTROINTESTINAL A:    PPI P:   -protonix  -NPO -if not extubated 1/15, consider early feeding   HEMATOLOGIC A:   Mild Anemia [place] at home prior to surgery (DVT proph post knee replacement?) P:  -monitor H/H trend -SCD's for DVT proph -medical DVT prophylaxis when OK by NSGY  INFECTIOUS A:   Post-Op Craniotomy  P:   -abx per NSGY  ENDOCRINE A:   At Risk Hyper / Hypoglycemia  P:   -CBG's Q6 while  NPO   Noe Gens, NP-C [facility] Pgr: [phone] or [phone]    I have personally obtained a history, examined the patient, evaluated laboratory and imaging results, formulated the assessment and plan and placed orders.  CRITICAL CARE: The patient is critically ill with multiple organ systems failure and requires high complexity decision making for assessment and support, frequent evaluation and titration of therapies, application of advanced monitoring technologies and extensive interpretation of multiple databases. Critical Care Time devoted to patient care services described in this note is 35 minutes.   11/28/2013, 1:26 AM   Attending:  I have seen and examined the patient with nurse practitioner/resident and agree with the note above.  Still not awake enough to extubate, but vent mechanics are great Serum urine well concentrated, not consistent with DI  Hold sedation Repeat BMET later today Continue IVF Tube feeds later today if mental status doesn't improve  Jillyn Hidden PCCM Pager: [phone] Cell: [phone] If no response, call [phone]

## 2013-11-28 NOTE — Progress Notes (Signed)
Pt urine output has continued to be 200-300 a hr. Checked a urine specific gravity and measured 1.016

## 2013-11-28 NOTE — Progress Notes (Signed)
Utilization review completed. Kynsley Whitehouse, RN, BSN. 

## 2013-11-28 NOTE — Progress Notes (Signed)
INITIAL NUTRITION ASSESSMENT  DOCUMENTATION CODES Per approved criteria  -Obesity Unspecified   INTERVENTION: If pt remains intubated recommend:  Initiate Vital High Protein @ 25 ml/hr and increase by 10 ml every 4 hours to goal rate of 45 ml/hr.  60 ml Prostat TID.  At goal rate, tube feeding regimen will provide 1680 kcal (63% of needs), 184 grams of protein, and 902 ml of H2O.    NUTRITION DIAGNOSIS: Inadequate oral intake related to inability to eat as evidenced by NPO status.  Goal: Enteral nutrition to provide 60-70% of estimated calorie needs (22-25 kcals/kg ideal body weight) and 100% of estimated protein needs, based on ASPEN guidelines for permissive underfeeding in critically ill obese individuals.  Monitor:  Vent status, TF initiation and tolerance, weight trend  Reason for Assessment: Ventilator  53 y.o. male  Admitting Dx: <principal problem not specified>  ASSESSMENT: Pt admitted due to abnormal MRI and CT of his brain 1/13. 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 Pt had craniotomy for resection/biopsy on 1/14. Pathology pending.  Patient is currently intubated on ventilator support.  MV: 12.5 L/min Temp (24hrs), Avg:100 F (37.8 C), Min:99.9 F (37.7 C), Max:100.1 F (37.8 C)  No family in pt's room.  Nutrition Focused Physical Exam:  Subcutaneous Fat:  Orbital Region: WNL Upper Arm Region: WNL Thoracic and Lumbar Region: WNL  Muscle:  Temple Region: NA Clavicle Bone Region: WNL Clavicle and Acromion Bone Region: WNL Scapular Bone Region: NA Dorsal Hand: NA Patellar Region: WNL Anterior Thigh Region: WNL Posterior Calf Region: WNL  Edema: not present   Height: Ht Readings from Last 1 Encounters:  11/21/2013 6\' 2"  (1.88 m)    Weight: Wt Readings from Last 1 Encounters:  12/09/2013 308 lb 3.3 oz (139.8 kg)  Admission weight 299 lb (135.6 kg)  Ideal Body Weight: 86.3 kg   % Ideal Body Weight: 162%  Wt Readings from Last 10  Encounters:  11/28/2013 308 lb 3.3 oz (139.8 kg)  11/28/2013 308 lb 3.3 oz (139.8 kg)    Usual Body Weight: unknown  % Usual Body Weight: -  BMI:  Body mass index is 39.55 kg/(m^2).  Estimated Nutritional Needs: Kcal: 2680 Protein: >/= 172 grams Fluid: > 2 L/day  Skin: head and knee incisions  Diet Order: NPO  EDUCATION NEEDS: -No education needs identified at this time   Intake/Output Summary (Last 24 hours) at 11/28/13 1101 Last data filed at 11/28/13 0900  Gross per 24 hour  Intake 5838.12 ml  Output   4650 ml  Net 1188.12 ml    Last BM: 1/13   Labs:   Recent Labs Lab 12/09/2013 1830 11/30/2013 1300 11/28/13 0500  NA 141 140 144  K 4.2 4.3 4.4  CL 104  --  104  CO2 25  --  26  BUN 17  --  17  CREATININE 1.01  --  1.00  CALCIUM 8.8  --  8.6  GLUCOSE 141*  --  182*    CBG (last 3)  No results found for this basename: GLUCAP,  in the last 72 hours  Scheduled Meds: . antiseptic oral rinse  15 mL Mouth Rinse QID  . chlorhexidine  15 mL Mouth Rinse BID  . cholecalciferol  400 Units Oral Daily  . dexamethasone  6 mg Intravenous Q6H   Followed by  . [START ON 11/29/2013] dexamethasone  4 mg Intravenous Q6H   Followed by  . [START ON 11/30/2013] dexamethasone  4 mg Intravenous Q8H  .  docusate sodium  100 mg Oral BID  . levETIRAcetam  500 mg Intravenous Q12H  . omega-3 acid ethyl esters  1 g Oral Daily  . pantoprazole (PROTONIX) IV  40 mg Intravenous Daily  . senna  1 tablet Oral BID    Continuous Infusions: . sodium chloride 75 mL/hr at 11/28/13 0700  . propofol 10 mcg/kg/min (11/28/13 0600)    No past medical history on file.  Past Surgical History  Procedure Laterality Date  . Left knee replacement  11/2013  . Craniotomy  11/30/2013    Tumor resection     Maylon Peppers RD, LDN, CNSC [phone] Pager [phone] After Hours Pager

## 2013-11-28 NOTE — Progress Notes (Addendum)
Pt seen and examined. No issues overnight. Pt remains unresponsive.  EXAM: Temp:  [97.9 F (36.6 C)-100.1 F (37.8 C)] 99.9 F (37.7 C) (01/15 0700) Pulse Rate:  [64-85] 83 (01/15 0800) Resp:  [15-31] 21 (01/15 0800) BP: (113-149)/(52-80) 149/76 mmHg (01/15 0800) SpO2:  [96 %-100 %] 98 % (01/15 0800) Arterial Line BP: (132-186)/(64-85) 186/85 mmHg (01/15 0800) FiO2 (%):  [40 %-100 %] 40 % (01/15 0800) Weight:  [139.8 kg (308 lb 3.3 oz)] 139.8 kg (308 lb 3.3 oz) (01/14 2240) Intake/Output     01/14 0701 - 01/15 0700 01/15 0701 - 01/16 0700   I.V. (mL/kg) 5533.1 (39.6) 75 (0.5)   IV Piggyback 155    Total Intake(mL/kg) 5688.1 (40.7) 75 (0.5)   Urine (mL/kg/hr) 4560 (1.4) 150 (0.6)   Blood 340 (0.1)    Total Output 4900 150   Net +788.1 -75         Eyes closed, no opening to pain Right pupil 46mm, reactive, left 62mm reactive Intubated, breathing spontaneously Localizes RUE, W/D LUE, BLE Wound dressed, c/d/i  LABS: Lab Results  Component Value Date   CREATININE 1.00 11/28/2013   BUN 17 11/28/2013   NA 144 11/28/2013   K 4.4 11/28/2013   CL 104 11/28/2013   CO2 26 11/28/2013   Lab Results  Component Value Date   WBC 16.1* 11/28/2013   HGB 12.3* 11/28/2013   HCT 36.9* 11/28/2013   MCV 83.9 11/28/2013   PLT 356 11/28/2013    IMPRESSION: - 53 y.o. male s/p craniotomy for resection of 3rd ventricular tumor, remains unresponsive  PLAN: - Cont close neurologic observation for increase in LOC - Cont dex and Keppra - Recheck serum Na with increased UO - pt may have postoperative endocrine dysfunction - Cont current vent mgmt - SBP goal < 164mmHg - Postop MRI w/w/o

## 2013-11-29 ENCOUNTER — Inpatient Hospital Stay (HOSPITAL_COMMUNITY): Payer: BC Managed Care – PPO

## 2013-11-29 LAB — GLUCOSE, CAPILLARY
GLUCOSE-CAPILLARY: 167 mg/dL — AB (ref 70–99)
Glucose-Capillary: 124 mg/dL — ABNORMAL HIGH (ref 70–99)
Glucose-Capillary: 168 mg/dL — ABNORMAL HIGH (ref 70–99)
Glucose-Capillary: 186 mg/dL — ABNORMAL HIGH (ref 70–99)
Glucose-Capillary: 188 mg/dL — ABNORMAL HIGH (ref 70–99)

## 2013-11-29 LAB — CBC
HCT: 34.8 % — ABNORMAL LOW (ref 39.0–52.0)
Hemoglobin: 11.6 g/dL — ABNORMAL LOW (ref 13.0–17.0)
MCH: 27.9 pg (ref 26.0–34.0)
MCHC: 33.3 g/dL (ref 30.0–36.0)
MCV: 83.7 fL (ref 78.0–100.0)
PLATELETS: 323 10*3/uL (ref 150–400)
RBC: 4.16 MIL/uL — AB (ref 4.22–5.81)
RDW: 13.7 % (ref 11.5–15.5)
WBC: 18.5 10*3/uL — ABNORMAL HIGH (ref 4.0–10.5)

## 2013-11-29 LAB — BASIC METABOLIC PANEL
BUN: 18 mg/dL (ref 6–23)
CALCIUM: 8.6 mg/dL (ref 8.4–10.5)
CO2: 27 mEq/L (ref 19–32)
Chloride: 102 mEq/L (ref 96–112)
Creatinine, Ser: 0.85 mg/dL (ref 0.50–1.35)
GFR calc Af Amer: >90 mL/min (ref >90)
GLUCOSE: 186 mg/dL — AB (ref 70–99)
Potassium: 4.3 mEq/L (ref 3.7–5.3)
Sodium: 140 mEq/L (ref 137–147)

## 2013-11-29 MED ORDER — GADOBENATE DIMEGLUMINE 529 MG/ML IV SOLN
20.0000 mL | Freq: Once | INTRAVENOUS | Status: AC | PRN
  Administered 2013-11-29: 20 mL via INTRAVENOUS

## 2013-11-29 MED ORDER — PRO-STAT SUGAR FREE PO LIQD
60.0000 mL | Freq: Three times a day (TID) | ORAL | Status: DC
  Administered 2013-11-29 – 2013-12-02 (×6): 60 mL
  Filled 2013-11-29 (×12): qty 60

## 2013-11-29 NOTE — Progress Notes (Signed)
When arriving back from MRI pt began following commands more often. Was able to wiggle toes, nod head appropriately and squeeze hands.

## 2013-11-29 NOTE — Progress Notes (Signed)
At 1345 pt transported to MRI via 100% fio2 ambu bagging, once in MRI, pt placed on MRI vent for procedure.  Pt then transported back to ICU via bagging on 100%. Once in ICU pt placed back on vent cp/psv.  No resp distress noted t/o procedure, no apparent complications noted.

## 2013-11-29 NOTE — Progress Notes (Signed)
NUTRITION FOLLOW UP  Intervention:    1. Continue Vital High Protein @ 45 ml/hr   2. Add 60 ml Prostat TID.   Tube feeding regimen provides 1680 kcal (63% of needs), 184 grams of protein, and 902 ml of H2O.   NUTRITION DIAGNOSIS:  Inadequate oral intake related to inability to eat as evidenced by NPO status; ongoing.   Goal:  Enteral nutrition to provide 60-70% of estimated calorie needs (22-25 kcals/kg ideal body weight) and 100% of estimated protein needs, based on ASPEN guidelines for permissive underfeeding in critically ill obese individuals; not met.   Monitor:  Vent status, TF initiation and tolerance, weight trend   Assessment:   Pt admitted due to abnormal MRI and CT of his brain 1/13. 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 Pt had craniotomy for resection/biopsy on 1/14. Pathology pending.   Patient is currently intubated on ventilator support.  MV: 10.4 L/min Temp (24hrs), Avg:100.1 F (37.8 C), Min:98.9 F (37.2 C), Max:101 F (38.3 C)   Height: Ht Readings from Last 1 Encounters:  12/03/2013 6' 2"  (1.88 m)    Weight Status:   Wt Readings from Last 1 Encounters:  11/29/2013 308 lb 3.3 oz (139.8 kg)  Admission weight 299 lb (135.6 kg)  Re-estimated needs:  Kcal: 2680  Protein: >/= 172 grams  Fluid: > 2 L/day  Skin: head and knee incisions  Diet Order:     Intake/Output Summary (Last 24 hours) at 11/29/13 0850 Last data filed at 11/29/13 0700  Gross per 24 hour  Intake   2320 ml  Output   2040 ml  Net    280 ml    Last BM: 1/13   Labs:   Recent Labs Lab 12/01/2013 1830 12/05/2013 1300 11/28/13 0500 11/28/13 1300 11/29/13 0500  NA 141 140 144 143 140  K 4.2 4.3 4.4  --  4.3  CL 104  --  104  --  102  CO2 25  --  26  --  27  BUN 17  --  17  --  18  CREATININE 1.01  --  1.00  --  0.85  CALCIUM 8.8  --  8.6  --  8.6  GLUCOSE 141*  --  182*  --  186*    CBG (last 3)   Recent Labs  11/28/13 1210 11/28/13 1922 11/29/13 0009   GLUCAP 179* 183* 186*    Scheduled Meds: . antiseptic oral rinse  15 mL Mouth Rinse QID  . chlorhexidine  15 mL Mouth Rinse BID  . cholecalciferol  400 Units Oral Daily  . dexamethasone  4 mg Intravenous Q6H   Followed by  . [START ON 11/30/2013] dexamethasone  4 mg Intravenous Q8H  . docusate sodium  100 mg Oral BID  . levETIRAcetam  500 mg Intravenous Q12H  . omega-3 acid ethyl esters  1 g Oral Daily  . pantoprazole (PROTONIX) IV  40 mg Intravenous Daily  . senna  1 tablet Oral BID    Continuous Infusions: . sodium chloride 75 mL/hr (11/29/13 0200)  . feeding supplement (VITAL HIGH PROTEIN) 1,000 mL (11/29/13 0000)  . propofol 10 mcg/kg/min (11/28/13 0600)    [place], [place], [place] Pager [phone] After Hours Pager

## 2013-11-29 NOTE — Progress Notes (Signed)
Name: Danny Suarez MRN: [id number] DOB: 1961-09-11    ADMISSION DATE:  11/19/2013 CONSULTATION DATE:  11/28/12  REFERRING MD :  Dr. Kathyrn Sheriff PRIMARY SERVICE: NSGY  CHIEF COMPLAINT:  Resp Failure  BRIEF PATIENT DESCRIPTION: 53 y/o M admitted on 1/13 after abnormal MRI/CT of brain with findings of anterior thrid ventricular tumor.  Underwent R craniotomy for resection / biopsy on 1/14.  Returned to ICU on vent.  Minimally responsive post-op.  SIGNIFICANT EVENTS / STUDIES:  1/13 - Admit for planned craniotomy after abnormal MRI / CT 1/14 - Craniotomy with tumor resection / bx per Dr. Kathyrn Sheriff  LINES / TUBES: OETT 1/15>>>  CULTURES / PATHOLOGY: Tumor Path 1/14>>>  ANTIBIOTICS: Ancef post OR 1/14>>1/15  HISTORY OF PRESENT ILLNESS:  53 y/o M, with little medical history who was admitted on 1/13 for planned craniotomy.  Patient underwent a left knee replacement approx 2 weeks prior to admit.  Wife noticed post surgery that the patient was exhibiting peculiar behavior and train of thought.  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 He was referred to Dr. Kathyrn Sheriff and was scheduled for tumor resection and biopsy.  Patient underwent biopsy on 1/14 and was returned to ICU on vent post-op.  PCCM consulted for vent management.   PAST MEDICAL HISTORY :  No past medical history on file. Past Surgical History  Procedure Laterality Date  . Left knee replacement  11/2013  . Craniotomy  12/01/2013    Tumor resection    Prior to Admission medications   Medication Sig Start Date End Date Taking? Authorizing Provider  Biotin 1000 MCG tablet Take 1,000 mcg by mouth daily.   Yes Historical Provider, MD  celecoxib (CELEBREX) 200 MG capsule Take 200 mg by mouth daily as needed (pain).   Yes Historical Provider, MD  CHOLECALCIFEROL PO Take 2 tablets by mouth daily.   Yes Historical Provider, MD  GLUCOSAMINE-CHONDROITIN-MSM PO Take 1  tablet by mouth 2 (two) times daily.    Yes Historical Provider, MD  HYDROcodone-acetaminophen (NORCO/VICODIN) 5-325 MG per tablet Take 1 tablet by mouth every 4 (four) hours as needed for moderate pain.   Yes Historical Provider, MD  Omega 3 1200 MG CAPS Take 1,200 mg by mouth daily.   Yes Historical Provider, MD  warfarin (COUMADIN) 5 MG tablet Take 5 mg by mouth daily. 11/14/13 12/05/13 Yes Historical Provider, MD   Allergies  Allergen Reactions  . Mobic [Meloxicam]   . Nuvigil [Armodafinil]   . Prednisolone     FAMILY HISTORY:  No family history on file.  SOCIAL HISTORY:  reports that he has never smoked. He does not have any smokeless tobacco history on file. His alcohol and drug histories are not on file.  REVIEW OF SYSTEMS:  Unable to complete as pt is on vent / sedated.    SUBJECTIVE: Urine concentrating well, even I&O balance. Continues to be minimally responsive to noxious stimuli.  VITAL SIGNS: Temp:  [98.9 F (37.2 C)-101 F (38.3 C)] 98.9 F (37.2 C) (01/16 0725) Pulse Rate:  [59-98] 60 (01/16 0700) Resp:  [14-24] 18 (01/16 0700) BP: (123-140)/(61-77) 127/63 mmHg (01/16 0700) SpO2:  [96 %-98 %] 97 % (01/16 0700) Arterial Line BP: (105-173)/(79-100) 173/81 mmHg (01/15 1700) FiO2 (%):  [40 %] 40 % (01/16 0700)  HEMODYNAMICS:    VENTILATOR SETTINGS: Vent Mode:  [-] PRVC FiO2 (%):  [40 %] 40 % Set Rate:  [16 bmp] 16  bmp Vt Set:  [600 mL] 600 mL PEEP:  [5 cmH20] 5 cmH20 Pressure Support:  [5 cmH20] 5 cmH20 Plateau Pressure:  [16 cmH20] 16 cmH20 INTAKE / OUTPUT: Intake/Output     01/15 0701 - 01/16 0700 01/16 0701 - 01/17 0700   I.V. (mL/kg) 1650 (11.8)    NG/GT 535    IV Piggyback 210    Total Intake(mL/kg) 2395 (17.1)    Urine (mL/kg/hr) 2190 (0.7)    Blood     Total Output 2190     Net +205            PHYSICAL EXAMINATION: General:  wdwn adult male in NAD Neuro:  Sedate on vent, synchronous  HEENT:  OETT, mm pink/moist, no JVD, swelling of R  orbital tissues, R pupil 43mm > L pupil 47mm, sluggish constriction Cardiovascular:  s1s2 rrr, no m/r/g, occ brady high 50s Lungs:  resp's even/non-labored, lungs bilaterally clear Abdomen:  Round/soft, bsx4 active, OGT  Musculoskeletal:  L TKR incision site healing with steri strips intact, no s/s of infection Skin:  Warm/dry, +1 generalized edema   LABS:  CBC  Recent Labs Lab 12/03/2013 1830 12/14/2013 1300 11/28/13 0500 11/29/13 0500  WBC 9.2  --  16.1* 18.5*  HGB 12.7* 11.9* 12.3* 11.6*  HCT 37.1* 35.0* 36.9* 34.8*  PLT 323  --  356 323   Coag's  Recent Labs Lab 12/10/2013 1830  APTT 29  INR 0.97   BMET  Recent Labs Lab 12/14/2013 1830 11/29/2013 1300 11/28/13 0500 11/28/13 1300 11/29/13 0500  NA 141 140 144 143 140  K 4.2 4.3 4.4  --  4.3  CL 104  --  104  --  102  CO2 25  --  26  --  27  BUN 17  --  17  --  18  CREATININE 1.01  --  1.00  --  0.85  GLUCOSE 141*  --  182*  --  186*   Electrolytes  Recent Labs Lab 12/01/2013 1830 11/28/13 0500 11/29/13 0500  CALCIUM 8.8 8.6 8.6   Sepsis Markers No results found for this basename: LATICACIDVEN, PROCALCITON, O2SATVEN,  in the last 168 hours ABG  Recent Labs Lab 11/28/2013 1300 11/28/13 0009  PHART 7.415 7.419  PCO2ART 41.6 35.8  PO2ART 177.0* 76.0*   Liver Enzymes No results found for this basename: AST, ALT, ALKPHOS, BILITOT, ALBUMIN,  in the last 168 hours Cardiac Enzymes No results found for this basename: TROPONINI, PROBNP,  in the last 168 hours Glucose  Recent Labs Lab 11/28/13 0549 11/28/13 1210 11/28/13 1922 11/29/13 0009  GLUCAP 137* 179* 183* 186*    Imaging Portable Chest Xray  11/28/2013   CLINICAL DATA:  Check endotracheal tube  EXAM: PORTABLE CHEST - 1 VIEW  COMPARISON:  None.  FINDINGS: Endotracheal tube ends in the mid thoracic trachea. Right IJ catheter at the level of the upper cavoatrial junction. Orogastric tube is coiled in to the stomach, tip residing in the fundus.  Low  volume lungs, likely accounting for perihilar opacities. No edema or effusion. No pneumothorax.  IMPRESSION: 1. Tubes and lines are in good position, as above. 2. Low volume lungs with perihilar opacities presumably representing atelectasis.   Electronically Signed   By: Jorje Guild M.D.   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 P:   -rec' s per NSGY -decadron taper  -keppra  Propofol gtt off >24 hrs. Minimize all sedation and opioids.  MRI repeat today.  PULMONARY A: Acute Respiratory Failure> respiratory mechanics favorable for extubation, but mental status limits P:   -extubate when mental status improved -SBT/WUA this am   CARDIOVASCULAR A:  Normotension  P:  -ICU monitoring -Continue IVF -PRN labetalol  RENAL A:   No acute issues  P:   -monitor renal fxn  GASTROINTESTINAL A:    PPI. Nutirition. P:   -protonix  Continue tube feedings at tolerated.  HEMATOLOGIC A:   Mild Anemia W[place]arin at home prior to surgery (DVT proph post knee replacement?) P:  -monitor H/H trend -SCD's for DVT proph -medical DVT prophylaxis when OK by NSGY  INFECTIOUS A:   Post-Op Craniotomy  P:   -abx per NSGY  ENDOCRINE A:   At Risk Hyper / Hypoglycemia  P:   -CBG's Q6 while on on continuous feeds.    Aniceto Boss [facility] 11/29/2013  0805  Attending:  I have seen and examined the patient with nurse practitioner/resident and agree with and have edited the note above.   Jillyn Hidden PCCM Pager: [phone] Cell: [phone] If no response, call [phone]

## 2013-11-29 NOTE — Op Note (Signed)
PREOP DIAGNOSIS: 3rd ventricular tumor   POSTOP DIAGNOSIS: Same  PROCEDURE: 1. Stereotactic right fronto-temporal craniotomy, resection of tumor 2. Use of intraoperative microscope for microdissection  SURGEON: Dr. Consuella Lose, MD  ASSISTANT: Dr. Dayton Bailiff, MD  ANESTHESIA: General Endotracheal  EBL: 300cc  SPECIMENS: Tumor for frozen/permanent pathology  DRAINS: None  COMPLICATIONS: None immediate  CONDITION: Stable to NeuroICU  HISTORY: Danny Suarez is a 53 y.o. male who initially presented the outpatient clinic after having undergone left-sided total knee replacement one week prior. He began to exhibit some odd behavior according to his wife and therefore CT scan was ordered which demonstrated a large tumor in the anterior third ventricle. Further workup was done which included MRI of the brain. Given the need for diagnosis and location of the tumor, surgical resection was indicated. The risks and benefits of the procedure were explained in detail to the patient and his wife. After all their questions were answered, verbal and written consent was obtained and placed in the chart.  PROCEDURE IN DETAIL: After informed consent was obtained and witnessed, the patient was brought to the operating room. After induction of general anesthesia, the patient was positioned on the operative table in the supine position. All pressure points were meticulously padded. The 3-point Mayfield head holder was then applied to the patient and affixed to the table. Utilizing the preoperative stereotactic MRI scan, surface markers were co-registered until satisfactory accuracy was achieved. A standard right-sided frontotemporal skin incision was then marked out and prepped and draped in the usual sterile fashion.  After timeout was conducted, skin incision was infiltrated with local anesthetic. Skin incision was then made sharply and Bovie electrocautery was used to dissect the subcutaneous  tissue and the galea aponeurosis. Raney clips were used for hemostasis and skin edges. The superficial temporal fascia and the superficial temporal fat pad were identified and incised sharply, and the deep temporal fascia was identified. Interfascial temporal dissection was then carried out and the cutaneous flap was reflected anteriorly. The superior and lateral margins of the orbit were identified, as was the zygoma. Temporalis muscle was then incised and elevated from the temporal bone and reflected inferiorly. Standard frontotemporal craniotomy was then created. The lesser wing of the sphenoid was then drilled down with a high-speed drill. Curvilinear dural incision was then made, and the dural leaflets were tacked up using 4 Nurolon stitches. At this point, the microscope was draped sterilely and brought into the field, and the remainder of the case was done under the microscope using microdissection.  Initially, a subfrontal approach was used to identify the optic nerve, and arachnoid dissection was carried out in the optical carotid space. This cistern was then released of CSF. Arachnoid dissection continued to identify the A1 on the right side, the ICA terminus, and the proximal M1. We continued our dissection lateral to the carotid to identify the third nerve. At this point, the medial sylvian fissure was then slowly split, and the MCA was traced until its bifurcation, and the proximal portion of the M2 were also traced. Having completed a good sylvian dissection, attention was then turned to dissection medially. The right A1 was then traced to the a, complex, and the contralateral A1 was identified. Dissection was then carried out across the optic chiasm and the left optic nerve and left internal carotid artery were identified. The anterior indicating artery complex was noted to be just above the optic chiasm and lamina terminalis, with multiple medial lenticulostriate perforators covering the superior  portion of the lamina terminalis. 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 There was no evidence on frozen section for craniopharyngioma.  With a lack of space in the lamina terminalis, attention was turned to the carotid ocular motor space. The tumor was seen to be just medial and posterior and inferior to the optic nerve and internal carotid artery. There was a small posterior indicating artery with small superiorly projecting perforating arteries. The gap in the perforators was identified and the posterior creating artery was coagulated and incised to provide more working room.   The tumor capsule was entered at this point, and using a combination of dissectors and ring curettes,  Pieces of the tumor were slowly removed. A significant portion of the inferior half of the tumor was debulked. This allowed further dissection into the carotid ocular motor space, until the little Quist membrane was identified and incised. Further dissection identified the basilar artery, as was the origin of the right superior cerebellar artery, right third nerve, and right posterior cerebral artery. Tumor in the interpeduncular cistern was debulked, and further dissection was carried out to identify the basilar apex, and the contralateral P1, Pcom, third nerve, and PCA. Further dissection and tumor debulking was carried out, until the tumor capsule and brainstem could not easily be dissected.  Further tumor was then debulked in the anterior fossa using a combination of ring curettes and dissectors. This continued for a period of time, until it no further tumor was easily dropped down to removed. At this point the decision was made to stop the resection given the lack of visualization of the tumor remaining in the mid and superior portion of the third ventricle.  At this point the wound was irrigated with copious amounts of normal saline  irrigation. Good hemostasis was confirme. The dura was then reapproximated using a combination of interrupted 4-0 Nurolon stitches. A small piece of DuraGen was then placed over the dural surface suture line. The large frontal sinus was then stripped of mucosa, packed with Gelfoam, and a small piece of temporalis muscle was harvested and packed into the sinus. Sinus was then covered with a layer of DuraSeal. Muscle was then closed using interrupted 0 Vicryl stitches, and the galea was closed using interrupted 3-0 Vicryl sutures. The skin was closed using standard surgical skin staples. Sterile dressing was then applied after the Mayfield head holder was removed. The patient was then transferred to the stretcher and taken to the neurointensive care unit in stable hemodynamic condition.  At the end of the case all sponge, needle, and instrument counts were correct.

## 2013-11-29 NOTE — Progress Notes (Addendum)
Pt seen and examined. No issues overnight.   EXAM: Temp:  [98.9 F (37.2 C)-101 F (38.3 C)] 98.9 F (37.2 C) (01/16 0725) Pulse Rate:  [57-85] 70 (01/16 1145) Resp:  [14-23] 20 (01/16 1145) BP: (122-139)/(60-69) 136/60 mmHg (01/16 1145) SpO2:  [96 %-100 %] 100 % (01/16 1145) Arterial Line BP: (165-173)/(79-81) 173/81 mmHg (01/15 1700) FiO2 (%):  [40 %] 40 % (01/16 1145) Weight:  [136.9 kg (301 lb 13 oz)] 136.9 kg (301 lb 13 oz) (01/16 1100) Intake/Output     01/15 0701 - 01/16 0700 01/16 0701 - 01/17 0700   I.V. (mL/kg) 1650 (11.8) 300 (2.2)   NG/GT 535 180   IV Piggyback 210    Total Intake(mL/kg) 2395 (17.1) 480 (3.5)   Urine (mL/kg/hr) 2190 (0.7)    Blood     Total Output 2190     Net +205 +480         Eyes closed, attempts to open with loud verbal stim Intubated, breathing spontaneously with minimal assist Right pupil 53mm, reactive, Left 73mm reactive Follows commands with vigorous stim RUE/BLE Wound c/d/i  LABS: Lab Results  Component Value Date   CREATININE 0.85 11/29/2013   BUN 18 11/29/2013   NA 140 11/29/2013   K 4.3 11/29/2013   CL 102 11/29/2013   CO2 27 11/29/2013   Lab Results  Component Value Date   WBC 18.5* 11/29/2013   HGB 11.6* 11/29/2013   HCT 34.8* 11/29/2013   MCV 83.7 11/29/2013   PLT 323 11/29/2013    IMPRESSION: - 53 y.o. male POD #2 s/p right craniotomy for resection of 3rd ventricular tumor, neurologically improving  PLAN: - Cont close neurologic observation - Cont dex, Keppra - Spoke with PCCM, will wait to extubate for increased LOC - Cont to monitor electrolytes daily - Postop MRI today

## 2013-11-30 DIAGNOSIS — J9600 Acute respiratory failure, unspecified whether with hypoxia or hypercapnia: Secondary | ICD-10-CM

## 2013-11-30 DIAGNOSIS — R4182 Altered mental status, unspecified: Secondary | ICD-10-CM

## 2013-11-30 LAB — GLUCOSE, CAPILLARY
GLUCOSE-CAPILLARY: 119 mg/dL — AB (ref 70–99)
GLUCOSE-CAPILLARY: 140 mg/dL — AB (ref 70–99)
GLUCOSE-CAPILLARY: 181 mg/dL — AB (ref 70–99)
GLUCOSE-CAPILLARY: 184 mg/dL — AB (ref 70–99)
Glucose-Capillary: 133 mg/dL — ABNORMAL HIGH (ref 70–99)
Glucose-Capillary: 154 mg/dL — ABNORMAL HIGH (ref 70–99)
Glucose-Capillary: 173 mg/dL — ABNORMAL HIGH (ref 70–99)

## 2013-11-30 MED ORDER — INSULIN ASPART 100 UNIT/ML ~~LOC~~ SOLN
0.0000 [IU] | SUBCUTANEOUS | Status: DC
  Administered 2013-11-30: 3 [IU] via SUBCUTANEOUS
  Administered 2013-11-30 (×2): 4 [IU] via SUBCUTANEOUS
  Administered 2013-11-30 – 2013-12-01 (×6): 3 [IU] via SUBCUTANEOUS
  Administered 2013-12-02 (×5): 4 [IU] via SUBCUTANEOUS
  Administered 2013-12-03: 3 [IU] via SUBCUTANEOUS
  Administered 2013-12-03 (×2): 4 [IU] via SUBCUTANEOUS
  Administered 2013-12-03 (×3): 3 [IU] via SUBCUTANEOUS
  Administered 2013-12-03: 4 [IU] via SUBCUTANEOUS
  Administered 2013-12-04: 3 [IU] via SUBCUTANEOUS
  Administered 2013-12-04 (×3): 4 [IU] via SUBCUTANEOUS
  Administered 2013-12-04: 3 [IU] via SUBCUTANEOUS
  Administered 2013-12-05: 4 [IU] via SUBCUTANEOUS
  Administered 2013-12-05: 7 [IU] via SUBCUTANEOUS
  Administered 2013-12-05 (×5): 4 [IU] via SUBCUTANEOUS
  Administered 2013-12-06: 3 [IU] via SUBCUTANEOUS
  Administered 2013-12-06 (×2): 4 [IU] via SUBCUTANEOUS
  Administered 2013-12-06: 3 [IU] via SUBCUTANEOUS
  Administered 2013-12-06 – 2013-12-07 (×4): 4 [IU] via SUBCUTANEOUS
  Administered 2013-12-07 (×3): 3 [IU] via SUBCUTANEOUS
  Administered 2013-12-08 (×3): 4 [IU] via SUBCUTANEOUS
  Administered 2013-12-08: 3 [IU] via SUBCUTANEOUS
  Administered 2013-12-08: 4 [IU] via SUBCUTANEOUS
  Administered 2013-12-08 – 2013-12-09 (×2): 7 [IU] via SUBCUTANEOUS
  Administered 2013-12-09 (×3): 4 [IU] via SUBCUTANEOUS
  Administered 2013-12-09: 3 [IU] via SUBCUTANEOUS
  Administered 2013-12-09 – 2013-12-10 (×2): 4 [IU] via SUBCUTANEOUS
  Administered 2013-12-10: 3 [IU] via SUBCUTANEOUS
  Administered 2013-12-10 (×2): 4 [IU] via SUBCUTANEOUS
  Administered 2013-12-10 – 2013-12-11 (×4): 3 [IU] via SUBCUTANEOUS
  Administered 2013-12-11: 4 [IU] via SUBCUTANEOUS
  Administered 2013-12-11 – 2013-12-12 (×5): 3 [IU] via SUBCUTANEOUS
  Administered 2013-12-12 (×3): 4 [IU] via SUBCUTANEOUS
  Administered 2013-12-13 – 2013-12-14 (×7): 3 [IU] via SUBCUTANEOUS
  Administered 2013-12-15: 4 [IU] via SUBCUTANEOUS
  Administered 2013-12-15: 3 [IU] via SUBCUTANEOUS
  Administered 2013-12-15: 7 [IU] via SUBCUTANEOUS
  Administered 2013-12-15 (×3): 4 [IU] via SUBCUTANEOUS
  Administered 2013-12-15: 3 [IU] via SUBCUTANEOUS
  Administered 2013-12-16: 4 [IU] via SUBCUTANEOUS
  Administered 2013-12-16: 7 [IU] via SUBCUTANEOUS
  Administered 2013-12-16 (×3): 4 [IU] via SUBCUTANEOUS
  Administered 2013-12-17 (×4): 3 [IU] via SUBCUTANEOUS
  Administered 2013-12-17: 4 [IU] via SUBCUTANEOUS
  Administered 2013-12-17: 3 [IU] via SUBCUTANEOUS
  Administered 2013-12-18: 4 [IU] via SUBCUTANEOUS
  Administered 2013-12-18 – 2013-12-19 (×5): 3 [IU] via SUBCUTANEOUS
  Administered 2013-12-19: 4 [IU] via SUBCUTANEOUS
  Administered 2013-12-19: 3 [IU] via SUBCUTANEOUS
  Administered 2013-12-19 – 2013-12-20 (×4): 4 [IU] via SUBCUTANEOUS
  Administered 2013-12-20 (×4): 3 [IU] via SUBCUTANEOUS
  Administered 2013-12-21: 4 [IU] via SUBCUTANEOUS
  Administered 2013-12-21 – 2013-12-22 (×8): 3 [IU] via SUBCUTANEOUS
  Administered 2013-12-23 (×2): 4 [IU] via SUBCUTANEOUS
  Administered 2013-12-23: 3 [IU] via SUBCUTANEOUS
  Administered 2013-12-23: 4 [IU] via SUBCUTANEOUS
  Administered 2013-12-23: 3 [IU] via SUBCUTANEOUS
  Administered 2013-12-24 (×2): 4 [IU] via SUBCUTANEOUS

## 2013-11-30 NOTE — Progress Notes (Signed)
SLP Cancellation Note  Patient Details Name: Danny Suarez MRN: [id number] DOB: 1961/05/24   Cancelled treatment:  ST received order for BSE.  Evaluation deferred secondary to patient presenting with lethargy with inability to participate fully.  ST to f/u on 12/01/13. Sharman Crate [place], [place] [facility] 11/30/2013, 1:59 PM

## 2013-11-30 NOTE — Progress Notes (Signed)
Open left eye to voice, pupil left 76mm, right 85mm. . Not f/c. To pain, moves both legs and the right arm. No movement with left arm. Postop mri seen . Continue as per CCM. SLIDING SDCALE Started

## 2013-11-30 NOTE — Progress Notes (Signed)
Patient ID: Danny Suarez, male   DOB: [birth year], 53 y.o.   MRN: [id number] Stable, answering questions. Spoke with his wife this am about the mri results

## 2013-11-30 NOTE — Progress Notes (Signed)
Name: Danny Suarez MRN: [id number] DOB: 15-Sep-1961    ADMISSION DATE:  11/16/2013 CONSULTATION DATE:  11/28/12  REFERRING MD :  Dr. Kathyrn Sheriff PRIMARY SERVICE: NSGY  CHIEF COMPLAINT:  Resp Failure  BRIEF PATIENT DESCRIPTION: 53 y/o M admitted on 1/13 after abnormal MRI/CT of brain with findings of anterior thrid ventricular tumor.  Underwent R craniotomy for resection / biopsy on 1/14.  Returned to ICU on vent.  Minimally responsive post-op.  SIGNIFICANT EVENTS / STUDIES:  1/13 - Admit for planned craniotomy after abnormal MRI / CT 1/14 - Craniotomy with tumor resection / bx per Dr. Kathyrn Sheriff  LINES / TUBES: OETT 1/15>>>1/17  CULTURES / PATHOLOGY: Tumor Path 1/14>>>  ANTIBIOTICS: Ancef post OR 1/14>>1/15  SUBJECTIVE: Awake, lethargic but interactive, no events overnight.  VITAL SIGNS: Temp:  [99 F (37.2 C)-100.1 F (37.8 C)] 99.2 F (37.3 C) (01/17 0800) Pulse Rate:  [51-78] 63 (01/17 0800) Resp:  [15-20] 15 (01/17 0800) BP: (124-161)/(59-80) 161/75 mmHg (01/17 0800) SpO2:  [96 %-100 %] 100 % (01/17 0800) FiO2 (%):  [40 %] 40 % (01/17 0800) Weight:  [135.8 kg (299 lb 6.2 oz)-136.9 kg (301 lb 13 oz)] 135.8 kg (299 lb 6.2 oz) (01/17 0600)  HEMODYNAMICS:     VENTILATOR SETTINGS: Vent Mode:  [-] PSV;CPAP FiO2 (%):  [40 %] 40 % Set Rate:  [16 bmp] 16 bmp Vt Set:  [600 mL] 600 mL PEEP:  [5 cmH20] 5 cmH20 Pressure Support:  [5 cmH20] 5 cmH20 Plateau Pressure:  [13 cmH20-16 cmH20] 16 cmH20 INTAKE / OUTPUT: Intake/Output     01/16 0701 - 01/17 0700 01/17 0701 - 01/18 0700   I.V. (mL/kg) 1800 (13.3) 75 (0.6)   NG/GT 1080 45   IV Piggyback 100    Total Intake(mL/kg) 2980 (21.9) 120 (0.9)   Urine (mL/kg/hr) 3325 (1) 320 (1.1)   Total Output 3325 320   Net -345 -200         PHYSICAL EXAMINATION: General:  wdwn adult male in NAD Neuro:  Sedate on vent, synchronous  HEENT:  OETT, mm pink/moist, no JVD, swelling of R orbital tissues, R pupil 82mm > L pupil  69mm, sluggish constriction Cardiovascular:  s1s2 rrr, no m/r/g, occ brady high 50s Lungs:  resp's even/non-labored, lungs bilaterally clear Abdomen:  Round/soft, bsx4 active, OGT  Musculoskeletal:  L TKR incision site healing with steri strips intact, no s/s of infection Skin:  Warm/dry, +1 generalized edema   LABS:  CBC  Recent Labs Lab 12/07/2013 1830 12/14/2013 1300 11/28/13 0500 11/29/13 0500  WBC 9.2  --  16.1* 18.5*  HGB 12.7* 11.9* 12.3* 11.6*  HCT 37.1* 35.0* 36.9* 34.8*  PLT 323  --  356 323   Coag's  Recent Labs Lab 11/21/2013 1830  APTT 29  INR 0.97   BMET  Recent Labs Lab 11/29/2013 1830 11/19/2013 1300 11/28/13 0500 11/28/13 1300 11/29/13 0500  NA 141 140 144 143 140  K 4.2 4.3 4.4  --  4.3  CL 104  --  104  --  102  CO2 25  --  26  --  27  BUN 17  --  17  --  18  CREATININE 1.01  --  1.00  --  0.85  GLUCOSE 141*  --  182*  --  186*   Electrolytes  Recent Labs Lab 11/30/2013 1830 11/28/13 0500 11/29/13 0500  CALCIUM 8.8 8.6 8.6   Sepsis Markers No results found for this basename: LATICACIDVEN, PROCALCITON, O2SATVEN,  in the last 168 hours ABG  Recent Labs Lab 12/06/2013 1300 11/28/13 0009  PHART 7.415 7.419  PCO2ART 41.6 35.8  PO2ART 177.0* 76.0*   Liver Enzymes No results found for this basename: AST, ALT, ALKPHOS, BILITOT, ALBUMIN,  in the last 168 hours Cardiac Enzymes No results found for this basename: TROPONINI, PROBNP,  in the last 168 hours Glucose  Recent Labs Lab 11/29/13 0500 11/29/13 1145 11/29/13 1642 11/29/13 2108 11/30/13 0030 11/30/13 0429  GLUCAP 168* 167* 124* 188* 181* 184*    Imaging Mr Brain W Wo Contrast  11/29/2013   CLINICAL DATA:  Postop suprasellar/third ventricular tumor resection.  EXAM: MRI HEAD WITHOUT AND WITH CONTRAST  TECHNIQUE: Multiplanar, multiecho pulse sequences of the brain and surrounding structures were obtained without and with intravenous contrast.  CONTRAST:  4mL MULTIHANCE GADOBENATE  DIMEGLUMINE 529 MG/ML IV SOLN  COMPARISON:  12/06/2013 and 11/15/2013 brain MRIs  FINDINGS: Sequelae of interval right frontotemporal craniotomy are identified. There is confluent restricted diffusion involving the inferior right frontal lobe, right basal ganglia, and right thalamus, consistent with acute infarcts. There is associated edema within these regions of infarct. Small right frontal postoperative extra-axial fluid collection measures up to 5 mm in thickness. Suprasellar/anterior third ventricular mass has been debulked. Residual irregularly enhancing soft tissue measures approximately 2.6 x 2.0 cm on axial images at (series 11, image 124) and approximately 2.3 cm in craniocaudal extent. Blood products are present in the resection cavity. There is approximately 5 mm of mild leftward midline shift. There is no hydrocephalus. Orbits are unremarkable. Mild right frontal and right ethmoid air cell mucosal thickening is noted. There is also mild right maxillary sinus mucosal thickening. Major intracranial vascular flow voids are present. Proximal right MCA is displaced posteriorly.  IMPRESSION: 1. Interval debulking of suprasellar/anterior third ventricular mass. 2. 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 These results will be called to the ordering clinician or representative by the Radiologist Assistant, and communication documented in the PACS Dashboard.   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 P:   - Rec' s per NSGY - Decadron taper  - Keppra - D/C propofol. - Minimize all sedation and opioids.   PULMONARY A: Acute Respiratory Failure> respiratory mechanics favorable for extubation, but mental status limits P:   - Extubate today, not sure if will be able to protect airway but this is  the most awake he has been. - Titrate o2 for sats. - OOB to chair.  CARDIOVASCULAR A:  Normotension  P:  - ICU monitoring. - Continue IVF until able to take PO. - PRN labetalol for BP control.  RENAL A:   No acute issues  P:   - Monitor renal fxn.  GASTROINTESTINAL A:    PPI. Nutirition. P:   - Protonix. - Once extubated will order swallow evaluation.  HEMATOLOGIC A:   Mild Anemia Wafarin at home prior to surgery (DVT proph post knee replacement?) P:  - Monitor H/H trend. - SCD's for DVT proph. - Medical DVT prophylaxis when OK by NSGY.  INFECTIOUS A:   Post-Op Craniotomy  P:   - Abx per NSGY off.  ENDOCRINE A:   At Risk Hyper / Hypoglycemia  P:   -CBG's Q6 while on on continuous feeds.  CC time 35 min.  I have seen and examined the patient with nurse practitioner/resident and agree with and have edited the note above.   Rush Farmer, M.D. [facility] Pulmonary/Critical Care Medicine. Pager: [phone]. After hours pager: [phone].

## 2013-11-30 NOTE — Procedures (Signed)
Extubation Procedure Note  Patient Details:   Name: Danny Suarez DOB: [birth year] MRN: [id number]   Airway Documentation:     Evaluation  O2 sats: stable throughout Complications: No apparent complications Patient did tolerate procedure well. Bilateral Breath Sounds: Rhonchi Suctioning: Airway No  Pt extubated to 4 LPM nasal cannula. Pt vitals are stable. Pt had good productive cough. Pt unable to speak name but had good air movement in neck. RT will continue to monitor.  Jovie Swanner M 11/30/2013, 9:36 AM

## 2013-12-01 ENCOUNTER — Inpatient Hospital Stay (HOSPITAL_COMMUNITY): Payer: BC Managed Care – PPO

## 2013-12-01 LAB — CBC
HEMATOCRIT: 33.7 % — AB (ref 39.0–52.0)
HEMOGLOBIN: 11.3 g/dL — AB (ref 13.0–17.0)
MCH: 28 pg (ref 26.0–34.0)
MCHC: 33.5 g/dL (ref 30.0–36.0)
MCV: 83.4 fL (ref 78.0–100.0)
Platelets: 308 10*3/uL (ref 150–400)
RBC: 4.04 MIL/uL — ABNORMAL LOW (ref 4.22–5.81)
RDW: 13.8 % (ref 11.5–15.5)
WBC: 11 10*3/uL — ABNORMAL HIGH (ref 4.0–10.5)

## 2013-12-01 LAB — GLUCOSE, CAPILLARY
GLUCOSE-CAPILLARY: 132 mg/dL — AB (ref 70–99)
Glucose-Capillary: 127 mg/dL — ABNORMAL HIGH (ref 70–99)
Glucose-Capillary: 127 mg/dL — ABNORMAL HIGH (ref 70–99)
Glucose-Capillary: 138 mg/dL — ABNORMAL HIGH (ref 70–99)
Glucose-Capillary: 148 mg/dL — ABNORMAL HIGH (ref 70–99)
Glucose-Capillary: 99 mg/dL (ref 70–99)

## 2013-12-01 LAB — BASIC METABOLIC PANEL
BUN: 20 mg/dL (ref 6–23)
CALCIUM: 9 mg/dL (ref 8.4–10.5)
CO2: 27 mEq/L (ref 19–32)
Chloride: 104 mEq/L (ref 96–112)
Creatinine, Ser: 0.77 mg/dL (ref 0.50–1.35)
GLUCOSE: 142 mg/dL — AB (ref 70–99)
POTASSIUM: 4.3 meq/L (ref 3.7–5.3)
Sodium: 145 mEq/L (ref 137–147)

## 2013-12-01 LAB — PHOSPHORUS: Phosphorus: 3.6 mg/dL (ref 2.3–4.6)

## 2013-12-01 LAB — MAGNESIUM: Magnesium: 2.2 mg/dL (ref 1.5–2.5)

## 2013-12-01 LAB — TRIGLYCERIDES: Triglycerides: 105 mg/dL (ref <150)

## 2013-12-01 MED ORDER — DOCUSATE SODIUM 50 MG/5ML PO LIQD
100.0000 mg | Freq: Two times a day (BID) | ORAL | Status: DC
  Administered 2013-12-01 – 2013-12-16 (×27): 100 mg via ORAL
  Filled 2013-12-01 (×32): qty 10

## 2013-12-01 NOTE — Progress Notes (Signed)
Name: Danny Suarez MRN: [id number] DOB: 30-Oct-1961    ADMISSION DATE:  11/24/2013 CONSULTATION DATE:  11/28/12  REFERRING MD :  Dr. Conchita Paris PRIMARY SERVICE: NSGY  CHIEF COMPLAINT:  Resp Failure  BRIEF PATIENT DESCRIPTION: 53 y/o M admitted on 1/13 after abnormal MRI/CT of brain with findings of anterior thrid ventricular tumor.  Underwent R craniotomy for resection / biopsy on 1/14.  Returned to ICU on vent.  Minimally responsive post-op.  SIGNIFICANT EVENTS / STUDIES:  1/13 - Admit for planned craniotomy after abnormal MRI / CT 1/14 - Craniotomy with tumor resection / bx per Dr. Conchita Paris  LINES / TUBES: OETT 1/15>>>1/17  CULTURES / PATHOLOGY: Tumor Path 1/14>>>  ANTIBIOTICS: Ancef post OR 1/14>>1/15  SUBJECTIVE: Very somnolent but wakes up with brisk stimulation.  Bradycardic overnight with normal BP.  VITAL SIGNS: Temp:  [97.6 F (36.4 C)-98.4 F (36.9 C)] 98.2 F (36.8 C) (01/18 0800) Pulse Rate:  [42-55] 44 (01/18 0900) Resp:  [10-22] 20 (01/18 0900) BP: (118-156)/(56-83) 119/59 mmHg (01/18 0900) SpO2:  [95 %-100 %] 97 % (01/18 0900) Weight:  [132.8 kg (292 lb 12.3 oz)] 132.8 kg (292 lb 12.3 oz) (01/18 0400)  HEMODYNAMICS:     VENTILATOR SETTINGS:   INTAKE / OUTPUT: Intake/Output     01/17 0701 - 01/18 0700 01/18 0701 - 01/19 0700   I.V. (mL/kg) 1800 (13.6) 150 (1.1)   NG/GT 90    IV Piggyback 210    Total Intake(mL/kg) 2100 (15.8) 150 (1.1)   Urine (mL/kg/hr) 4210 (1.3) 320 (0.7)   Total Output 4210 320   Net -2110 -170         PHYSICAL EXAMINATION: General:  wdwn adult male in NAD. Neuro:  Very somnolent but withdraws all ext to pain.  HEENT: Post crani, mm pink/moist, no JVD, swelling of R orbital tissues, reactivity of pupils sluggish Cardiovascular:  s1s2 rrr, no m/r/g, occ brady high 50s Lungs:  resp's even/non-labored, lungs bilaterally clear Abdomen:  Round/soft, bsx4 active, OGT  Musculoskeletal:  L TKR incision site healing with  steri strips intact, no s/s of infection Skin:  Warm/dry, +1 generalized edema   LABS:  CBC  Recent Labs Lab 11/28/13 0500 11/29/13 0500 12/01/13 0520  WBC 16.1* 18.5* 11.0*  HGB 12.3* 11.6* 11.3*  HCT 36.9* 34.8* 33.7*  PLT 356 323 308   Coag's  Recent Labs Lab 11/17/2013 1830  APTT 29  INR 0.97   BMET  Recent Labs Lab 11/28/13 0500 11/28/13 1300 11/29/13 0500 12/01/13 0520  NA 144 143 140 145  K 4.4  --  4.3 4.3  CL 104  --  102 104  CO2 26  --  27 27  BUN 17  --  18 20  CREATININE 1.00  --  0.85 0.77  GLUCOSE 182*  --  186* 142*   Electrolytes  Recent Labs Lab 11/28/13 0500 11/29/13 0500 12/01/13 0520  CALCIUM 8.6 8.6 9.0  MG  --   --  2.2  PHOS  --   --  3.6   Sepsis Markers No results found for this basename: LATICACIDVEN, PROCALCITON, O2SATVEN,  in the last 168 hours ABG  Recent Labs Lab 12/10/2013 1300 11/28/13 0009  PHART 7.415 7.419  PCO2ART 41.6 35.8  PO2ART 177.0* 76.0*   Liver Enzymes No results found for this basename: AST, ALT, ALKPHOS, BILITOT, ALBUMIN,  in the last 168 hours Cardiac Enzymes No results found for this basename: TROPONINI, PROBNP,  in the last 168 hours Glucose  Recent Labs Lab 11/30/13 1147 11/30/13 1551 11/30/13 2018 11/30/13 2349 12/01/13 0334 12/01/13 0804  GLUCAP 154* 119* 140* 133* 132* 127*    Imaging Mr Brain W Wo Contrast  11/29/2013   CLINICAL DATA:  Postop suprasellar/third ventricular tumor resection.  EXAM: MRI HEAD WITHOUT AND WITH CONTRAST  TECHNIQUE: Multiplanar, multiecho pulse sequences of the brain and surrounding structures were obtained without and with intravenous contrast.  CONTRAST:  70mL MULTIHANCE GADOBENATE DIMEGLUMINE 529 MG/ML IV SOLN  COMPARISON:  12/03/2013 and 11/15/2013 brain MRIs  FINDINGS: Sequelae of interval right frontotemporal craniotomy are identified. There is confluent restricted diffusion involving the inferior right frontal lobe, right basal ganglia, and right  thalamus, consistent with acute infarcts. There is associated edema within these regions of infarct. Small right frontal postoperative extra-axial fluid collection measures up to 5 mm in thickness. Suprasellar/anterior third ventricular mass has been debulked. Residual irregularly enhancing soft tissue measures approximately 2.6 x 2.0 cm on axial images at (series 11, image 124) and approximately 2.3 cm in craniocaudal extent. Blood products are present in the resection cavity. There is approximately 5 mm of mild leftward midline shift. There is no hydrocephalus. Orbits are unremarkable. Mild right frontal and right ethmoid air cell mucosal thickening is noted. There is also mild right maxillary sinus mucosal thickening. Major intracranial vascular flow voids are present. Proximal right MCA is displaced posteriorly.  IMPRESSION: 1. Interval debulking of suprasellar/anterior third ventricular mass. 2. 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 These results will be called to the ordering clinician or representative by the Radiologist Assistant, and communication documented in the PACS Dashboard.   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  Not classic cushing triad, concern for increase ICH, will defer to neurosurg whether to CT or not. P:   - Rec' s per NSGY. - Decadron taper. - Keppra for seizure prophylaxis. - D/C propofol. - Minimize all sedation and opioids.  - Will need swallow evaluation when more awake.  PULMONARY A: Acute Respiratory Failure> respiratory mechanics favorable for extubation, but mental status limits P:   - Extubated, holding sats but airway protection remains a concern. - Titrate o2 for sats. - Mobilize once more  responsive.  CARDIOVASCULAR A:  Normotension  P:  - ICU monitoring. - Continue IVF until able to take PO. - D/C labetalol.  RENAL A:   No acute issues  P:   - Monitor renal fxn.  GASTROINTESTINAL A:    PPI. Nutirition. P:   - Protonix. - Insert NGT. - Restart TF.  HEMATOLOGIC A:   Mild Anemia Wafarin at home prior to surgery (DVT proph post knee replacement?) P:  - Monitor H/H trend. - SCD's for DVT proph. - Medical DVT prophylaxis when OK by NSGY.  INFECTIOUS A:   Post-Op Craniotomy  P:   - Abx per NSGY off.  ENDOCRINE A:   At Risk Hyper / Hypoglycemia  P:   -CBG's Q6 while on on continuous feeds.  Still very concerned for airway protection, would hold in the ICU until more responsive.  Will defer to NS whether to order a CT of the head or now but in the meantime will d/c beta blockers.  Insert NGT and restart TF.  CC time 35 min.  I have seen  and examined the patient with nurse practitioner/resident and agree with and have edited the note above.   Rush Farmer, M.D. [facility] Pulmonary/Critical Care Medicine. Pager: [phone]. After hours pager: [phone].

## 2013-12-01 NOTE — Evaluation (Signed)
Physical Therapy Evaluation Patient Details Name: Danny Suarez MRN: [id number] DOB: Dec 18, 1960 Today's Date: 12/01/2013 Time: 2993-7169 PT Time Calculation (min): 20 min  PT Assessment / Plan / Recommendation History of Present Illness  53 y/o M admitted on 1/13 after abnormal MRI/CT of brain with findings of anterior thrid ventricular tumor.  Underwent R craniotomy for resection / biopsy on 1/14. Pt is s/p Craniotomy with tumor resection.    Clinical Impression  Patient is s/p craniotomy with tumor resection surgery on 1/14 resulting in functional limitations due to the deficits listed below (see PT Problem List). Patient will benefit from skilled PT to increase their independence and safety with mobility to allow discharge to the venue listed below. Pt was adm from [facility], where he has been since his Lt TKA on 12/29 per wife. Per wife, pt was independent prior to Lt TKA. Pt to be good candidate for CIR prior to D/C home with wife. Pt needs to be mod I to supervision level to safely D/C home with wife. Wife is physically unable to provide physical (A) to pt but does provide good support.      PT Assessment  Patient needs continued PT services    Follow Up Recommendations  CIR    Does the patient have the potential to tolerate intense rehabilitation      Barriers to Discharge Decreased caregiver support;Inaccessible home environment pt wife unable to provide physical (A); is responsible for the care of their downs syndrome child     Equipment Recommendations  Other (comment) (TBD)    Recommendations for Other Services OT consult;Rehab consult   Frequency Min 3X/week    Precautions / Restrictions Precautions Precautions: Fall Precaution Comments: Pt with recent Lt TKA  Restrictions Weight Bearing Restrictions: No (not specified in orders)   Pertinent Vitals/Pain Did not complain of pain.       Mobility  Bed Mobility Overal bed mobility: +2 for  physical assistance;+ 2 for safety/equipment;Needs Assistance Bed Mobility: Sit to Supine;Sit to Sidelying (scooting HOB) Sit to supine: HOB elevated;+2 for physical assistance;+2 for safety/equipment;Total assist Sit to sidelying: +2 for safety/equipment;+2 for physical assistance (HOB flattened) General bed mobility comments: pt required a person to (A) with LE advancement to/off EOB while second person brings shoulders to upright sitting position; use of draw pad to bring hips to EOB; pt able to follow commands; pt required 2 person total (A) to scoot to [place] transfer comment: unable to safely assess due to arousal state          PT Diagnosis: Difficulty walking;Generalized weakness;Acute pain  PT Problem List: Decreased strength;Decreased activity tolerance;Decreased balance;Decreased cognition;Decreased knowledge of use of DME;Decreased safety awareness;Decreased knowledge of precautions;Pain PT Treatment Interventions: DME instruction;Gait training;Stair training;Functional mobility training;Therapeutic activities;Therapeutic exercise;Balance training;Neuromuscular re-education;Patient/family education     PT Goals(Current goals can be found in the care plan section) Acute Rehab PT Goals Patient Stated Goal: none stated PT Goal Formulation: With patient/family Time For Goal Achievement: 12/15/13 Potential to Achieve Goals: Good  Visit Information  Last PT Received On: 12/01/13 Assistance Needed: +2 History of Present Illness: 53 y/o M admitted on 1/13 after abnormal MRI/CT of brain with findings of anterior thrid ventricular tumor.  Underwent R craniotomy for resection / biopsy on 1/14. Pt is s/p Craniotomy with tumor resection.         Prior [place] expects to be discharged to:: Private residence Living Arrangements: Spouse/significant other Available Help at Discharge:  Family;Available PRN/intermittently Type of Home:  House Home Access: Stairs to enter [employer] of Steps: 2 Entrance Stairs-Rails: Can reach both;Left;Right Home Layout: One level Home Equipment: None Additional Comments: Spoke with wife via telephone; wife has heart condition and is unable to provide much physical (A). wife is also responsible for care of daughter with Cherlyn Cushing Syndrome Prior Function Level of Independence: Independent Comments: pt was independent and working prior to [employer] TKA which ocurred 12/29; since this time pt has been at [facility]  Communication Communication: No difficulties Dominant Hand: Right    Cognition  Cognition Arousal/Alertness:  (eyes closed but responds appropriately to questions ) Behavior During Therapy: Flat affect Overall Cognitive Status: Impaired/Different from baseline Area of Impairment: Orientation;Problem solving;Awareness;Safety/judgement;Following commands Orientation Level: Disoriented to;Situation Memory: Decreased short-term memory Following Commands: Follows one step commands with increased time;Follows one step commands consistently Safety/Judgement: Decreased awareness of deficits;Decreased awareness of safety Awareness: Emergent Problem Solving: Slow processing;Requires verbal cues;Requires tactile cues General Comments: Pt was able to answer questions; kept eyes closed most of the time during session     Extremity/Trunk Assessment Upper Extremity Assessment Upper Extremity Assessment: Defer to OT evaluation Lower Extremity Assessment Lower Extremity Assessment: Generalized weakness;Difficult to assess due to impaired cognition (Lt LE limited due to Lt TKA ) Cervical / Trunk Assessment Cervical / Trunk Assessment: Normal   Balance Balance Overall balance assessment: Needs assistance Sitting-balance support: No upper extremity supported;Feet supported Sitting balance-Leahy Scale: Poor Sitting balance - Comments: pt tolerated sitting EOB with  posterior (A) to maintain upright position; pt was able to brace himelf by pulling anteriorly to hold himself up but not for more than 30 seconds and then he required posterior bracing; tolerated sitting EOB ~7 min  Postural control: Posterior lean  End of Session PT - End of Session Activity Tolerance: Patient tolerated treatment well Patient left: in bed;with call bell/phone within reach Nurse Communication: Mobility status  GP     Gustavus Bryant, [place] 12/01/2013, 9:45 AM

## 2013-12-01 NOTE — Progress Notes (Signed)
SLP Cancellation Note  Patient Details Name: Danny Suarez MRN: [id number] DOB: 1961/07/06   Cancelled treatment: Attempt x1 to complete BSE.  Evaluation not completed as patient presenting with lethargy with inability to participate fully.  ST to f/u on 12/02/13. Sharman Crate [place], [facility] 12/01/2013, 4:31 PM

## 2013-12-01 NOTE — Progress Notes (Signed)
ID: Danny Suarez, male   DOB: [birth year], 53 y.o.   MRN: [id number] right ptosis, moves all extremities but left upper. F/c.  Vs wwnl

## 2013-12-02 ENCOUNTER — Encounter (HOSPITAL_COMMUNITY): Payer: Self-pay

## 2013-12-02 DIAGNOSIS — R4020 Unspecified coma: Secondary | ICD-10-CM

## 2013-12-02 DIAGNOSIS — G9340 Encephalopathy, unspecified: Secondary | ICD-10-CM

## 2013-12-02 LAB — CBC
HEMATOCRIT: 35.1 % — AB (ref 39.0–52.0)
HEMOGLOBIN: 11.9 g/dL — AB (ref 13.0–17.0)
MCH: 28.1 pg (ref 26.0–34.0)
MCHC: 33.9 g/dL (ref 30.0–36.0)
MCV: 82.8 fL (ref 78.0–100.0)
Platelets: 315 10*3/uL (ref 150–400)
RBC: 4.24 MIL/uL (ref 4.22–5.81)
RDW: 13.8 % (ref 11.5–15.5)
WBC: 9.3 10*3/uL (ref 4.0–10.5)

## 2013-12-02 LAB — GLUCOSE, CAPILLARY
GLUCOSE-CAPILLARY: 165 mg/dL — AB (ref 70–99)
GLUCOSE-CAPILLARY: 161 mg/dL — AB (ref 70–99)
Glucose-Capillary: 157 mg/dL — ABNORMAL HIGH (ref 70–99)
Glucose-Capillary: 155 mg/dL — ABNORMAL HIGH (ref 70–99)
Glucose-Capillary: 160 mg/dL — ABNORMAL HIGH (ref 70–99)

## 2013-12-02 LAB — PHOSPHORUS: PHOSPHORUS: 3.8 mg/dL (ref 2.3–4.6)

## 2013-12-02 LAB — BASIC METABOLIC PANEL
BUN: 33 mg/dL — AB (ref 6–23)
CALCIUM: 9 mg/dL (ref 8.4–10.5)
CO2: 26 mEq/L (ref 19–32)
Chloride: 103 mEq/L (ref 96–112)
Creatinine, Ser: 0.81 mg/dL (ref 0.50–1.35)
GFR calc Af Amer: >90 mL/min (ref >90)
GLUCOSE: 190 mg/dL — AB (ref 70–99)
Potassium: 4.3 mEq/L (ref 3.7–5.3)
Sodium: 143 mEq/L (ref 137–147)

## 2013-12-02 LAB — MAGNESIUM: Magnesium: 2.4 mg/dL (ref 1.5–2.5)

## 2013-12-02 MED ORDER — SODIUM CHLORIDE 0.9 % IV SOLN
INTRAVENOUS | Status: AC
  Administered 2013-12-02 – 2013-12-15 (×5): via INTRAVENOUS

## 2013-12-02 MED ORDER — VITAL HIGH PROTEIN PO LIQD
1000.0000 mL | ORAL | Status: DC
  Administered 2013-12-02 – 2013-12-03 (×2): 1000 mL
  Filled 2013-12-02 (×4): qty 1000

## 2013-12-02 NOTE — Progress Notes (Signed)
Name: Danny Suarez MRN: [id number] DOB: 12/23/1960    ADMISSION DATE:  11/20/2013 CONSULTATION DATE:  11/28/12  REFERRING MD :  Dr. Kathyrn Sheriff PRIMARY SERVICE: NSGY  CHIEF COMPLAINT:  Resp Failure  BRIEF PATIENT DESCRIPTION: 53 y/o M admitted on 1/13 after abnormal MRI/CT of brain with findings of anterior thrid ventricular tumor.  Underwent R craniotomy for resection / biopsy on 1/14.  Returned to ICU on vent.  Minimally responsive post-op.  SIGNIFICANT EVENTS / STUDIES:  1/13 - Admit for planned craniotomy after abnormal MRI / CT 1/14 - Craniotomy with tumor resection / bx per Dr. Kathyrn Sheriff 12/01/13: ery somnolent but wakes up with brisk stimulation.  Bradycardic overnight with normal BP.  LINES / TUBES: OETT 1/15>>>1/17  CULTURES / PATHOLOGY: Tumor Path 1/14>>>  ANTIBIOTICS: Ancef post OR 1/14>>1/15  SUBJECTIVE:   12/02/13: a little bit more responsive v same as of 24h ago. Remains extubated. Can protect airway somewhat  VITAL SIGNS: Temp:  [97.5 F (36.4 C)-98.5 F (36.9 C)] 98.5 F (36.9 C) (01/19 0700) Pulse Rate:  [29-54] 47 (01/19 0900) Resp:  [10-20] 18 (01/19 0900) BP: (108-143)/(48-80) 119/68 mmHg (01/19 0900) SpO2:  [93 %-98 %] 98 % (01/19 0900) Weight:  [130.5 kg (287 lb 11.2 oz)] 130.5 kg (287 lb 11.2 oz) (01/19 0400)  HEMODYNAMICS:     VENTILATOR SETTINGS:   INTAKE / OUTPUT: Intake/Output     01/18 0701 - 01/19 0700 01/19 0701 - 01/20 0700   I.V. (mL/kg) 675 (5.2)    NG/GT 748.8 90   IV Piggyback 210    Total Intake(mL/kg) 1633.8 (12.5) 90 (0.7)   Urine (mL/kg/hr) 3080 (1) 175 (0.5)   Total Output 3080 175   Net -1446.3 -85         PHYSICAL EXAMINATION: General:  wdwn adult male in NAD. Neuro:  Very somnolent but withdraws all ext to pain. Approxinmate RASS equivalent is -2/-3 HEENT: Post crani, mm pink/moist, no JVD, swelling of R orbital tissues, reactivity of pupils sluggish Cardiovascular:  s1s2 rrr, no m/r/g, occ brady high  50s Lungs:  resp's even/non-labored, lungs bilaterally clear Abdomen:  Round/soft, bsx4 active, OGT  Musculoskeletal:  L TKR incision site healing with steri strips intact, no s/s of infection Skin:  Warm/dry, +1 generalized edema   LABS: PULMONARY  Recent Labs Lab 11/23/2013 1300 11/28/13 0009  PHART 7.415 7.419  PCO2ART 41.6 35.8  PO2ART 177.0* 76.0*  HCO3 26.8* 23.2  TCO2 28 24  O2SAT 100.0 95.0    CBC  Recent Labs Lab 11/29/13 0500 12/01/13 0520 12/02/13 0415  HGB 11.6* 11.3* 11.9*  HCT 34.8* 33.7* 35.1*  WBC 18.5* 11.0* 9.3  PLT 323 308 315    COAGULATION  Recent Labs Lab 12/02/2013 1830  INR 0.97    CARDIAC  No results found for this basename: TROPONINI,  in the last 168 hours No results found for this basename: PROBNP,  in the last 168 hours   CHEMISTRY  Recent Labs Lab 11/25/2013 1830  11/28/13 0500 11/28/13 1300 11/29/13 0500 12/01/13 0520 12/02/13 0415  NA 141  < > 144 143 140 145 143  K 4.2  < > 4.4  --  4.3 4.3 4.3  CL 104  --  104  --  102 104 103  CO2 25  --  26  --  27 27 26   GLUCOSE 141*  --  182*  --  186* 142* 190*  BUN 17  --  17  --  18 20  33*  CREATININE 1.01  --  1.00  --  0.85 0.77 0.81  CALCIUM 8.8  --  8.6  --  8.6 9.0 9.0  MG  --   --   --   --   --  2.2 2.4  PHOS  --   --   --   --   --  3.6 3.8  < > = values in this interval not displayed. Estimated Creatinine Clearance: 154.9 ml/min (by C-G formula based on Cr of 0.81).   LIVER  Recent Labs Lab 11/18/2013 1830  INR 0.97     INFECTIOUS No results found for this basename: LATICACIDVEN, PROCALCITON,  in the last 168 hours   ENDOCRINE CBG (last 3)   Recent Labs  12/01/13 2332 12/02/13 0353 12/02/13 0744  GLUCAP 148* 165* 161*         IMAGING x48h  Dg Abd Portable 1v  12/01/2013   CLINICAL DATA:  Assess esophagogastric tube placement  EXAM: PORTABLE ABDOMEN - 1 VIEW  COMPARISON:  None.  FINDINGS: The radiodense tipped feeding tube like structure  lies in the left upper quadrant of the abdomen likely within the body of the stomach. The bowel gas pattern appears normal where visualized.  IMPRESSION: The feeding tube type appliance appears to lie in the region of the gastric fundus. During the patient in the right side down position may be useful in assisting distal migration of the tube.   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  Not classic cushing triad, concern for increase ICH, will defer to neurosurg whether to CT or not.  12/02/13: No change. Not on opioids or benzo.   P:   - Rec' s per NSGY. - Decadron taper. - Keppra for seizure prophylaxis. - Minimize all sedation and opioids.  - Will need swallow evaluation when more awake.  PULMONARY A: Acute Respiratory Failure> respiratory mechanics favorable for extubation, but mental status limits  12/02/13: s.p exrtubation 24h ago. Holding ground  P:   - - Titrate o2 for sats. - Mobilize once more responsive.  CARDIOVASCULAR A:  Normotension  P:  - ICU monitoring. - Continue IVF until able to take PO. - D/C labetalol since 1/18.  RENAL A:   No acute issues  P:   - Monitor renal fxn.  GASTROINTESTINAL A:    PPI. Nutirition. P:   - Protonix. - Insert NGT. - Restart TF per nutrition  HEMATOLOGIC A:   Mild Anemia Wafarin at home prior to surgery (DVT proph post knee replacement?) P:  - Monitor H/H trend. - SCD's for DVT proph. - Medical DVT prophylaxis when OK by NSGY. - PRBC for hgb < 7gm%  INFECTIOUS A:   Post-Op Craniotomy  P:   - Abx per NSGY off.  ENDOCRINE A:   At Risk Hyper / Hypoglycemia  P:   -CBG's Q6 while on on continuous feeds.   Global: keep in ICU. Mental status coma makes him ICU candidate   Dr. Brand Males, M.D., [facility].C.P Pulmonary and  Critical Care Medicine Staff Physician [place] [facility] Pager: [phone], If no answer or between  15:00h - 7:00h: call [phone]  12/02/2013 10:07 AM

## 2013-12-02 NOTE — Plan of Care (Signed)
Problem: Consults Goal: Diagnosis - Craniotomy Tumor     

## 2013-12-02 NOTE — Progress Notes (Signed)
NUTRITION FOLLOW UP  Intervention:    1. Start Vital High Protein @ 45 (previous rate) and increase by 10 ml every 4 hours to goal rate of 80 ml/hr.  Tube feeding regimen provides 1920 kcal, 168 grams of protein, and 1605 ml of H2O.   NUTRITION DIAGNOSIS:  Inadequate oral intake related to inability to eat as evidenced by NPO status; ongoing.   Goal:  Enteral nutrition to provide 22-25 kcals/kg ideal body weight and 100% of estimated protein needs, based on ASPEN guidelines for permissive underfeeding in critically ill obese individuals; not met.   Monitor:  TF initiation and tolerance, weight trend, labs  Assessment:   Pt admitted due to abnormal MRI and CT of his brain 1/13. 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 Pt had craniotomy for resection/biopsy on 1/14. .   Per MRI 1/16 pt with acute strokes (right basal ganglia, right thalamus, and inferior right frontal lobe). Pt extubated 1/18 but remains obtunded. Consult to re-start TF.  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   Height: Ht Readings from Last 1 Encounters:  12/07/2013 6' 2"  (1.88 m)    Weight Status:   Wt Readings from Last 1 Encounters:  12/02/13 287 lb 11.2 oz (130.5 kg)  Admission weight 299 lb (135.6 kg)  Re-estimated needs:  Kcal: 1900-2100 Protein: >/= 172 grams  Fluid: > 2 L/day  Skin: head and knee incisions  Diet Order:     Intake/Output Summary (Last 24 hours) at 12/02/13 1339 Last data filed at 12/02/13 1300  Gross per 24 hour  Intake 1403.75 ml  Output   2690 ml  Net -1286.25 ml    Last BM: 1/13   Labs:   Recent Labs Lab 11/29/13 0500 12/01/13 0520 12/02/13 0415  NA 140 145 143  K 4.3 4.3 4.3  CL 102 104 103  CO2 27 27 26   BUN 18 20 33*  CREATININE 0.85 0.77 0.81  CALCIUM 8.6 9.0 9.0  MG  --  2.2 2.4  PHOS  --  3.6 3.8  GLUCOSE 186* 142* 190*    CBG (last 3)   Recent Labs  12/02/13 0353 12/02/13 0744 12/02/13 1153  GLUCAP  165* 161* 157*    Scheduled Meds: . antiseptic oral rinse  15 mL Mouth Rinse QID  . chlorhexidine  15 mL Mouth Rinse BID  . cholecalciferol  400 Units Oral Daily  . dexamethasone  4 mg Intravenous Q8H  . docusate  100 mg Oral BID  . feeding supplement (PRO-STAT SUGAR FREE 64)  60 mL Per Tube TID  . insulin aspart  0-20 Units Subcutaneous Q4H  . levETIRAcetam  500 mg Intravenous Q12H  . pantoprazole (PROTONIX) IV  40 mg Intravenous Daily    Continuous Infusions: . sodium chloride 20 mL/hr at 12/02/13 1300  . feeding supplement (VITAL HIGH PROTEIN) 1,000 mL (12/02/13 1300)    [place], [place], [place] Pager [phone] After Hours Pager

## 2013-12-02 NOTE — Progress Notes (Signed)
Rehab Admissions Coordinator Note:  Patient was screened by Kyiah Canepa L for appropriateness for an Inpatient Acute Rehab Consult.  At this time, we are recommending Inpatient Rehab consult.  Kathe Wirick, PT Rehabilitation Admissions Coordinator [phone]  

## 2013-12-02 NOTE — Progress Notes (Signed)
Pt seen and examined. No issues overnight. Pt somnolent, does not provide any significant history.  EXAM: Temp:  [97.5 F (36.4 C)-98.5 F (36.9 C)] 98.1 F (36.7 C) (01/19 1100) Pulse Rate:  [29-54] 52 (01/19 1500) Resp:  [11-20] 17 (01/19 1500) BP: (108-143)/(48-103) 120/67 mmHg (01/19 1500) SpO2:  [92 %-99 %] 92 % (01/19 1500) Weight:  [130.5 kg (287 lb 11.2 oz)] 130.5 kg (287 lb 11.2 oz) (01/19 0400) Intake/Output     01/18 0701 - 01/19 0700 01/19 0701 - 01/20 0700   I.V. (mL/kg) 675 (5.2) 95 (0.7)   NG/GT 748.8 382.5   IV Piggyback 210    Total Intake(mL/kg) 1633.8 (12.5) 477.5 (3.7)   Urine (mL/kg/hr) 3080 (1) 525 (0.4)   Total Output 3080 525   Net -1446.3 -47.5         Somnolent but arousable by voice Says name, recognizes family Opens left eye, complete right ptosis Right CN III palsy FC, moves all extremities with good strength Wound c/d/i  LABS: Lab Results  Component Value Date   CREATININE 0.81 12/02/2013   BUN 33* 12/02/2013   NA 143 12/02/2013   K 4.3 12/02/2013   CL 103 12/02/2013   CO2 26 12/02/2013   Lab Results  Component Value Date   WBC 9.3 12/02/2013   HGB 11.9* 12/02/2013   HCT 35.1* 12/02/2013   MCV 82.8 12/02/2013   PLT 315 12/02/2013    IMAGING: Postop MRI reviewed demonstrating debulking of tumor from posterior/inferior 3rd ventricle and separation from pos fossa structures. Tumor remains under left ON and superior 3rd ventricle. Anterior thalamic and caudate stroke is post-operative  IMPRESSION: - 53 y.o. male POD# 5 s/p crani for debulking 3rd ventricular GBM, improving neurologically  PLAN: - Cont close observation in ICU - Cont dex, keppra - Start TF  I spoke with the patient's wife and reviewed the pre- and post-op MRI. I also reviewed the pathology results. The patient's case was also discussed at the multidisciplinary brain tumor conference today. At this point, the plan would be to consider repeat operation for further cytoreduction  as part of a maximally aggressive treatment plan should the patient cont to recover. I reviewed this plan with the patient's wife. All her questions were answered.

## 2013-12-02 NOTE — Progress Notes (Signed)
UR completed.  Delainey Winstanley, RN BSN M[facility] CCM Trauma/Neuro ICU Case Manager [phone]  

## 2013-12-02 NOTE — Progress Notes (Signed)
SLP Cancellation Note  Patient Details Name: Danny Suarez MRN: [id number] DOB: 08-Feb-1961   Cancelled treatment:       Reason Eval/Treat Not Completed: Medical issues which prohibited therapy (Per RN, remains obtunded. Will f/u 1/20. )  Gabriel Rainwater Gardner, CCC-SLP [phone]  [name] Meryl 12/02/2013, 11:14 AM

## 2013-12-03 ENCOUNTER — Encounter (HOSPITAL_COMMUNITY): Payer: Self-pay | Admitting: Neurosurgery

## 2013-12-03 LAB — GLUCOSE, CAPILLARY
GLUCOSE-CAPILLARY: 147 mg/dL — AB (ref 70–99)
GLUCOSE-CAPILLARY: 149 mg/dL — AB (ref 70–99)
GLUCOSE-CAPILLARY: 153 mg/dL — AB (ref 70–99)
GLUCOSE-CAPILLARY: 154 mg/dL — AB (ref 70–99)
Glucose-Capillary: 138 mg/dL — ABNORMAL HIGH (ref 70–99)
Glucose-Capillary: 150 mg/dL — ABNORMAL HIGH (ref 70–99)
Glucose-Capillary: 168 mg/dL — ABNORMAL HIGH (ref 70–99)

## 2013-12-03 NOTE — Progress Notes (Signed)
Physical Therapy Treatment Patient Details Name: Ibn Stief MRN: [id number] DOB: 06-Apr-1961 Today's Date: 12/03/2013 Time: 8469-6295 PT Time Calculation (min): 20 min  PT Assessment / Plan / Recommendation  History of Present Illness 53 y/o M admitted on 1/13 after abnormal MRI/CT of brain with findings of anterior thrid ventricular tumor.  Underwent R craniotomy for resection / biopsy on 1/14. Pt is s/p Craniotomy with tumor resection.     PT Comments   Pt with consistent command follow and was able to verbalize answers with increased time. Pt able to say name and that he had a daughter and her name. Pt demo's L sided weakness greater than R. Pt con't to have difficulty maintaining eye opening however responds well to PT. Pt easily fatigued. Pt con't to be an excellent candidate for CIR upon d/c to achieve maximal functional recovery.  Follow Up Recommendations  CIR     Does the patient have the potential to tolerate intense rehabilitation     Barriers to Discharge        Equipment Recommendations       Recommendations for Other Services OT consult;Rehab consult  Frequency Min 3X/week   Progress towards PT Goals Progress towards PT goals: Progressing toward goals  Plan Current plan remains appropriate    Precautions / Restrictions Precautions Precautions: Fall Precaution Comments: Pt with recent Lt TKA  Restrictions Weight Bearing Restrictions: No   Pertinent Vitals/Pain Stable t/o session    Mobility  Bed Mobility Overal bed mobility: +2 for physical assistance;+ 2 for safety/equipment;Needs Assistance Bed Mobility: Sit to Supine;Sit to Sidelying Sit to supine: HOB elevated;+2 for physical assistance;+2 for safety/equipment;Total assist Sit to sidelying: +2 for safety/equipment;+2 for physical assistance;Max assist General bed mobility comments: pt required maximal verbal and tactile cues to initial transfer. Pt able to move R LE to EOB but required assist with L  LE. Pt required total assist for trunk elevation and to bring hip to EOB Transfers Overall transfer level: Needs assistance General transfer comment: attempted to stand however unable to clear patients bottom from bed. pt with no active assistance and unable to use bilat UEs to support self despite maximum v/c's    Exercises     PT Diagnosis:    PT Problem List:   PT Treatment Interventions:     PT Goals (current goals can now be found in the care plan section)    Visit Information  Last PT Received On: 12/03/13 Assistance Needed: +2 History of Present Illness: 53 y/o M admitted on 1/13 after abnormal MRI/CT of brain with findings of anterior thrid ventricular tumor.  Underwent R craniotomy for resection / biopsy on 1/14. Pt is s/p Craniotomy with tumor resection.      Subjective Data      Cognition  Cognition Arousal/Alertness: Lethargic Behavior During Therapy: Flat affect Overall Cognitive Status: Impaired/Different from baseline Area of Impairment: Orientation;Problem solving;Awareness;Safety/judgement;Following commands Orientation Level: Situation Following Commands: Follows one step commands with increased time Safety/Judgement: Decreased awareness of deficits Awareness: Emergent Problem Solving: Slow processing;Requires verbal cues;Requires tactile cues General Comments: pt appropriate but unable to maintain eyes open. pt reports keeping eye open to be difficult. when eyes "open" only L eye is open, pt keeps R eye closed    Balance  Balance Overall balance assessment: Needs assistance Sitting-balance support: Bilateral upper extremity supported;Feet supported Sitting balance-Leahy Scale: Poor Sitting balance - Comments: once bilat UE in supportive position pt able to maintain upright position with modA. pt unable to maintain cervical  extension Postural control: Posterior lean  End of Session PT - End of Session Equipment Utilized During Treatment: Gait  belt Activity Tolerance: Patient tolerated treatment well Patient left: in bed;with call bell/phone within reach Nurse Communication: Mobility status   GP     Kingsley Callander 12/03/2013, 12:30 PM  Kittie Plater, PT, DPT Pager #: [phone] Office #: [phone]

## 2013-12-03 NOTE — Progress Notes (Signed)
SLP Cancellation Note  Patient Details Name: Danny Suarez MRN: [id number] DOB: [birth year]   Cancelled treatment:        RN reported pt. Continued to be obtunded.  Will defer to 1/21 for speech-language-cognitive assessment.   Houston Siren 12/03/2013, 2:51 PM

## 2013-12-03 NOTE — Progress Notes (Signed)
Name: Danny Suarez MRN: [id number] DOB: 1961-08-16    ADMISSION DATE:  12/11/2013 CONSULTATION DATE:  11/28/12  REFERRING MD :  Dr. Kathyrn Sheriff PRIMARY SERVICE: NSGY  CHIEF COMPLAINT:  Resp Failure  BRIEF PATIENT DESCRIPTION: 53 y/o M admitted on 1/13 after abnormal MRI/CT of brain with findings of anterior thrid ventricular tumor.  Underwent R craniotomy for resection / biopsy on 1/14.  Returned to ICU on vent.  Minimally responsive post-op.  SIGNIFICANT EVENTS / STUDIES:  1/13 - Admit for planned craniotomy after abnormal MRI / CT 1/14 - Craniotomy with tumor resection / bx per Dr. Kathyrn Sheriff 12/01/13: ery somnolent but wakes up with brisk stimulation.  Bradycardic overnight with normal BP. 12/02/13: a little bit more responsive v same as of 24h ago. Remains extubated. Can protect airway somewhat  LINES / TUBES: OETT 1/15>>>1/17  CULTURES / PATHOLOGY: Tumor Path 1/14>>> GLIOBLASTOMA MULTIFORME 4  ANTIBIOTICS: Ancef post OR 1/14>>1/15  SUBJECTIVE:   12/03/13: Still somnolent but some more awake compared to 24h earlier   VITAL SIGNS: Temp:  [97.7 F (36.5 C)-98.4 F (36.9 C)] 98.3 F (36.8 C) (01/20 0721) Pulse Rate:  [42-57] 49 (01/20 0900) Resp:  [12-18] 17 (01/20 0900) BP: (107-127)/(61-103) 127/76 mmHg (01/20 0900) SpO2:  [92 %-100 %] 96 % (01/20 0900) Weight:  [131 kg (288 lb 12.8 oz)] 131 kg (288 lb 12.8 oz) (01/20 0400)   INTAKE / OUTPUT: Intake/Output     01/19 0701 - 01/20 0700 01/20 0701 - 01/21 0700   I.V. (mL/kg) 415 (3.2) 40 (0.3)   NG/GT 1247.5 100   IV Piggyback 210    Total Intake(mL/kg) 1872.5 (14.3) 140 (1.1)   Urine (mL/kg/hr) 2175 (0.7) 360 (1.1)   Total Output 2175 360   Net -302.5 -220         PHYSICAL EXAMINATION: General:  wdwn adult male in NAD. Neuro: RASS -/1/-2 equivalent. Follows commands. Moves 4s. Seems to understand. Opening eyes a bit more HEENT: Post crani, mm pink/moist, no JVD, swelling of R orbital tissues, reactivity  of pupils sluggish Cardiovascular:  s1s2 rrr, no m/r/g, occ brady high 50s Lungs:  resp's even/non-labored, lungs bilaterally clear Abdomen:  Round/soft, bsx4 active, OGT  Musculoskeletal:  L TKR incision site healing with steri strips intact, no s/s of infection Skin:  Warm/dry, +1 generalized edema   LABS: PULMONARY  Recent Labs Lab 12/03/2013 1300 11/28/13 0009  PHART 7.415 7.419  PCO2ART 41.6 35.8  PO2ART 177.0* 76.0*  HCO3 26.8* 23.2  TCO2 28 24  O2SAT 100.0 95.0    CBC  Recent Labs Lab 11/29/13 0500 12/01/13 0520 12/02/13 0415  HGB 11.6* 11.3* 11.9*  HCT 34.8* 33.7* 35.1*  WBC 18.5* 11.0* 9.3  PLT 323 308 315    COAGULATION  Recent Labs Lab 11/19/2013 1830  INR 0.97    CARDIAC  No results found for this basename: TROPONINI,  in the last 168 hours No results found for this basename: PROBNP,  in the last 168 hours   CHEMISTRY  Recent Labs Lab 11/17/2013 1830  11/28/13 0500 11/28/13 1300 11/29/13 0500 12/01/13 0520 12/02/13 0415  NA 141  < > 144 143 140 145 143  K 4.2  < > 4.4  --  4.3 4.3 4.3  CL 104  --  104  --  102 104 103  CO2 25  --  26  --  27 27 26   GLUCOSE 141*  --  182*  --  186* 142* 190*  BUN 17  --  17  --  18 20 33*  CREATININE 1.01  --  1.00  --  0.85 0.77 0.81  CALCIUM 8.8  --  8.6  --  8.6 9.0 9.0  MG  --   --   --   --   --  2.2 2.4  PHOS  --   --   --   --   --  3.6 3.8  < > = values in this interval not displayed. Estimated Creatinine Clearance: 155.2 ml/min (by C-G formula based on Cr of 0.81).   LIVER  Recent Labs Lab 12/08/2013 1830  INR 0.97     INFECTIOUS No results found for this basename: LATICACIDVEN, PROCALCITON,  in the last 168 hours   ENDOCRINE CBG (last 3)   Recent Labs  12/03/13 0014 12/03/13 0325 12/03/13 0720  GLUCAP 154* 168* 153*         IMAGING x48h  Dg Abd Portable 1v  12/01/2013   CLINICAL DATA:  Assess esophagogastric tube placement  EXAM: PORTABLE ABDOMEN - 1 VIEW  COMPARISON:   None.  FINDINGS: The radiodense tipped feeding tube like structure lies in the left upper quadrant of the abdomen likely within the body of the stomach. The bowel gas pattern appears normal where visualized.  IMPRESSION: The feeding tube type appliance appears to lie in the region of the gastric fundus. During the patient in the right side down position may be useful in assisting distal migration of the tube.   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  Not classic cushing triad, concern for increase ICH, will defer to neurosurg whether to CT or not.  11/2013: No change. Not on opioids or benzo except prn fent but not given  P:   - Rec' s per NSGY. - Decadron taper per NSGY - dc fent prn - Keppra for seizure prophylaxis. - Minimize all sedation and opioids.  - Will need swallow evaluation when more awake.  PULMONARY A: Acute Respiratory Failure> respiratory mechanics favorable for extubation, but mental status limits  11/2013: s.p exrtubation 48h ago. Holding ground  P:   - - Titrate o2 for sats. - Mobilize once more responsive.  CARDIOVASCULAR A:  Normotension  P:  - ICU monitoring. - Continue IVF until able to take PO. - D/C labetalol since 1/18.  RENAL A:   No acute issues  P:   - Monitor renal fxn.  GASTROINTESTINAL A:    PPI. Nutirition. P:   - Protonix. - Insert NGT. - Restart TF per nutrition  HEMATOLOGIC A:   Mild Anemia Wafarin at home prior to surgery (DVT proph post knee replacement?) P:  - Monitor H/H trend. - SCD's for DVT proph. - Medical DVT prophylaxis when OK by NSGY. - PRBC for hgb < 7gm%  INFECTIOUS A:   Post-Op Craniotomy  P:   - Abx per NSGY off.  ENDOCRINE A:   At Risk Hyper / Hypoglycemia  P:   -CBG's Q6 while on on continuous  feeds.   Global: keep in ICU. Mental status coma makes him ICU candidate. PCCM will sign off   Dr. Brand Males, M.D., [facility].C.P Pulmonary and Critical Care Medicine Staff Physician [place] [facility] Pager: [phone], If no answer or between  15:00h -  7:00h: call [phone]  12/03/2013 9:35 AM

## 2013-12-04 LAB — GLUCOSE, CAPILLARY
GLUCOSE-CAPILLARY: 177 mg/dL — AB (ref 70–99)
GLUCOSE-CAPILLARY: 151 mg/dL — AB (ref 70–99)
Glucose-Capillary: 138 mg/dL — ABNORMAL HIGH (ref 70–99)
Glucose-Capillary: 148 mg/dL — ABNORMAL HIGH (ref 70–99)
Glucose-Capillary: 163 mg/dL — ABNORMAL HIGH (ref 70–99)
Glucose-Capillary: 176 mg/dL — ABNORMAL HIGH (ref 70–99)

## 2013-12-04 MED ORDER — VITAL HIGH PROTEIN PO LIQD
1000.0000 mL | ORAL | Status: DC
  Administered 2013-12-04 – 2013-12-06 (×5): 1000 mL
  Filled 2013-12-04 (×7): qty 1000

## 2013-12-04 NOTE — Progress Notes (Signed)
Pt seen and examined. No issues overnight.   EXAM: Temp:  [98 F (36.7 C)-98.3 F (36.8 C)] 98 F (36.7 C) (01/21 1100) Pulse Rate:  [38-54] 54 (01/21 1100) Resp:  [0-38] 16 (01/21 1100) BP: (119-140)/(64-81) 124/79 mmHg (01/21 1100) SpO2:  [92 %-100 %] 97 % (01/21 1100) Weight:  [129.6 kg (285 lb 11.5 oz)] 129.6 kg (285 lb 11.5 oz) (01/21 0340) Intake/Output     01/20 0701 - 01/21 0700 01/21 0701 - 01/22 0700   I.V. (mL/kg) 440 (3.4) 80 (0.6)   NG/GT 1100 212.5   IV Piggyback 100 105   Total Intake(mL/kg) 1640 (12.7) 397.5 (3.1)   Urine (mL/kg/hr) 2535 (0.8) 420 (0.5)   Total Output 2535 420   Net -895 -22.5         Very drowsy, but arousable by voice Says name Opens left eye Responds to verbal commands with stimulation Complete right CN III palsy Follows commands BUE/BLE Wound c/d/i  LABS: Lab Results  Component Value Date   CREATININE 0.81 12/02/2013   BUN 33* 12/02/2013   NA 143 12/02/2013   K 4.3 12/02/2013   CL 103 12/02/2013   CO2 26 12/02/2013   Lab Results  Component Value Date   WBC 9.3 12/02/2013   HGB 11.9* 12/02/2013   HCT 35.1* 12/02/2013   MCV 82.8 12/02/2013   PLT 315 12/02/2013    IMPRESSION: - 53 y.o. 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 Slightly more easily awakened today.  PLAN: - Cont observation in ICU - Cont SLP eval when more awake - Will consider transfer to floor in next 1-2 days if stable.

## 2013-12-04 NOTE — Progress Notes (Signed)
NUTRITION FOLLOW UP  Intervention:    1. Vital High Protein @ 80 m/hr.   Tube feeding regimen provides 1920 kcal, 168 grams of protein, and 1605 ml of H2O.   NUTRITION DIAGNOSIS:  Inadequate oral intake related to inability to eat as evidenced by NPO status; ongoing.   Goal:  Enteral nutrition to provide 22-25 kcals/kg ideal body weight and 100% of estimated protein needs, based on ASPEN guidelines for permissive underfeeding in critically ill obese individuals; met.   Monitor:  TF initiation and tolerance, weight trend, labs  Assessment:   Pt admitted due to abnormal MRI and CT of his brain 1/13. 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 Pt had craniotomy for resection/biopsy on 1/14. .   Per MRI 1/16 pt with acute strokes (right basal ganglia, right thalamus, and inferior right frontal lobe). Pt extubated 1/18   Pt remains obtunded.  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   Height: Ht Readings from Last 1 Encounters:  12/02/2013 6' 2"  (1.60 m)    Weight Status:   Wt Readings from Last 1 Encounters:  12/04/13 285 lb 11.5 oz (129.6 kg)  Admission weight 299 lb (135.6 kg)  Re-estimated needs:  Kcal: 1900-2100 Protein: >/= 172 grams  Fluid: > 2 L/day  Skin: head and knee incisions  Diet Order:     Intake/Output Summary (Last 24 hours) at 12/04/13 1342 Last data filed at 12/04/13 1137  Gross per 24 hour  Intake 1617.5 ml  Output   2120 ml  Net -502.5 ml    Last BM: 1/13 Notified RN  Labs:   Recent Labs Lab 11/29/13 0500 12/01/13 0520 12/02/13 0415  NA 140 145 143  K 4.3 4.3 4.3  CL 102 104 103  CO2 27 27 26   BUN 18 20 33*  CREATININE 0.85 0.77 0.81  CALCIUM 8.6 9.0 9.0  MG  --  2.2 2.4  PHOS  --  3.6 3.8  GLUCOSE 186* 142* 190*    CBG (last 3)   Recent Labs  12/04/13 0340 12/04/13 0728 12/04/13 1132  GLUCAP 176* 138* 177*    Scheduled Meds: . antiseptic oral rinse  15 mL Mouth Rinse QID  .  cholecalciferol  400 Units Oral Daily  . dexamethasone  4 mg Intravenous Q8H  . docusate  100 mg Oral BID  . insulin aspart  0-20 Units Subcutaneous Q4H  . levETIRAcetam  500 mg Intravenous Q12H  . pantoprazole (PROTONIX) IV  40 mg Intravenous Daily    Continuous Infusions: . sodium chloride 20 mL/hr at 12/03/13 1900  . feeding supplement (VITAL HIGH PROTEIN) 1,000 mL (12/04/13 0945)    [place], [place], [place] Pager [phone] After Hours Pager

## 2013-12-04 NOTE — Evaluation (Addendum)
Clinical/Bedside Swallow Evaluation Patient Details  Name: Danny Suarez MRN: [id number] Date of Birth: 08-19-1961  Today's Date: 12/04/2013 Time: 1007-1018 SLP Time Calculation (min): 11 min  Past Medical History:  Past Medical History  Diagnosis Date  . Sleep apnea    Past Surgical History:  Past Surgical History  Procedure Laterality Date  . Left knee replacement  11/2013  . Craniotomy  12/11/2013    Tumor resection   . Craniotomy Right 11/23/2013    Procedure: RIGHT CRANIOTOMY FOR RESECTION OF TUMOR;  Surgeon: Consuella Lose, MD;  Location: [facility];  Service: Neurosurgery;  Laterality: Right;  RIGHT CRANIOTOMY FOR RESECTION OF TUMOR   HPI:  Danny Suarez is a 53 year old man initially seen by neurosurgeon for discussion of an abnormal MRI and CT scan of the brain. He is approximately two-week status post left sided knee replacement, however upon questioning he states that his wife noticed that over the last several weeks he has been acting a little bit peculiar. She says that she noted he was saying things which didn't make sense, some vague memory problems.  MRI revealed demonstrates a heterogeneously enhancing anterior inferior third ventricular lesion with possible infiltration into bilateral hypothalamus (glioblastoma) and pt. underwent stereotactic right fronto-temporal craniotomy, resection of tumor 12/01/2013.  Pt. 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   Assessment / Plan / Recommendation Clinical Impression  Pt. continues with lethargy and will intermittently responded to yes/no questions with head gestures.  Followed one command with verbal stimuli and eyes closed during session.  Following oral care he was unable to manipulate puree texture with maz verbal and tactile cues, therefore po suctioned from oral cavity.  Continue PANDA feeds and SLP will continue  to see for po appropriateness.    Aspiration Risk  Severe    Diet Recommendation NPO        Other  Recommendations Oral Care Recommendations: Oral care Q4 per protocol   Follow Up Recommendations   (TBD)    Frequency and Duration min 2x/week  2 weeks   Pertinent Vitals/Pain WDL         Swallow Study         Oral/Motor/Sensory Function Labial ROM:  (unable to assess)   Ice Chips Ice chips: Not tested   Thin Liquid Thin Liquid: Not tested    Nectar Thick Nectar Thick Liquid: Not tested   Honey Thick Honey Thick Liquid: Not tested   Puree Puree: Impaired Presentation: Spoon Oral Phase Impairments: Reduced labial seal;Impaired anterior to posterior transit;Poor awareness of bolus Oral Phase Functional Implications: Oral holding   Solid   GO    Solid: Not tested       Danny Suarez M.Ed [employer] [phone]  12/04/2013

## 2013-12-05 LAB — POCT I-STAT 3, ART BLOOD GAS (G3+)
Acid-Base Excess: 1 mmol/L (ref 0.0–2.0)
Bicarbonate: 25.8 mEq/L — ABNORMAL HIGH (ref 20.0–24.0)
O2 SAT: 95 %
TCO2: 27 mmol/L (ref 0–100)
pCO2 arterial: 39 mmHg (ref 35.0–45.0)
pH, Arterial: 7.429 (ref 7.350–7.450)
pO2, Arterial: 75 mmHg — ABNORMAL LOW (ref 80.0–100.0)

## 2013-12-05 LAB — GLUCOSE, CAPILLARY
GLUCOSE-CAPILLARY: 154 mg/dL — AB (ref 70–99)
Glucose-Capillary: 167 mg/dL — ABNORMAL HIGH (ref 70–99)
Glucose-Capillary: 183 mg/dL — ABNORMAL HIGH (ref 70–99)
Glucose-Capillary: 183 mg/dL — ABNORMAL HIGH (ref 70–99)
Glucose-Capillary: 217 mg/dL — ABNORMAL HIGH (ref 70–99)
Glucose-Capillary: 184 mg/dL — ABNORMAL HIGH (ref 70–99)

## 2013-12-05 MED ORDER — BISACODYL 10 MG RE SUPP
10.0000 mg | Freq: Every day | RECTAL | Status: DC | PRN
  Administered 2013-12-06 – 2013-12-15 (×2): 10 mg via RECTAL
  Filled 2013-12-05 (×2): qty 1

## 2013-12-05 MED ORDER — MAGNESIUM CITRATE PO SOLN
1.0000 | Freq: Once | ORAL | Status: AC
  Administered 2013-12-06: 1
  Filled 2013-12-05: qty 296

## 2013-12-05 NOTE — Progress Notes (Signed)
Physical Therapy Treatment Patient Details Name: Danny Suarez MRN: [id number] DOB: 1961-09-09 Today's Date: 12/05/2013 Time: 7253-6644 PT Time Calculation (min): 24 min  PT Assessment / Plan / Recommendation  History of Present Illness 53 y/o M admitted on 1/13 after abnormal MRI/CT of brain with findings of anterior thrid ventricular tumor.  Underwent R craniotomy for resection / biopsy on 1/14. Pt is s/p Craniotomy with tumor resection.     PT Comments   Pt less consistent with following cues and arousal today. Very lethargic during session. Required 2+ (A) for bed mobility and (A) to maintain sitting EOB. Attempted multiple strategies to improve WBin through UEs and to achieve cervical extension. Pt limited due to lethargy. Pt presented with Rt cervical rotation and Lt side bend throughout session today.  Pt was able to state who he lived with and was aware he was in the hospital. Will cont to follow per POC and recommend CIR.   Follow Up Recommendations  CIR     Does the patient have the potential to tolerate intense rehabilitation     Barriers to Discharge        Equipment Recommendations  Other (comment)    Recommendations for Other Services OT consult;Rehab consult  Frequency Min 3X/week   Progress towards PT Goals Progress towards PT goals: Progressing toward goals  Plan Current plan remains appropriate    Precautions / Restrictions Precautions Precautions: Fall Precaution Comments: Pt with recent Lt TKA  Restrictions Weight Bearing Restrictions: No   Pertinent Vitals/Pain VSS t/o session. RN present     Mobility  Bed Mobility Overal bed mobility: +2 for physical assistance;+ 2 for safety/equipment;Needs Assistance Bed Mobility: Supine to Sit;Sit to Supine Supine to sit: HOB elevated Sit to supine: HOB elevated;+2 for physical assistance;+2 for safety/equipment;Total assist Sit to sidelying: +2 for safety/equipment;+2 for physical assistance;Max assist General  bed mobility comments: pt required 2+ (A) for positioning on EOB; pt with inconsitent response to one step commands today; was given max multimodal cues for sequencing; multiple exercises to stimulate WB through bil UEs and [name]; pt very flaccid in UEs today and demo decreased ability to WB or hold himeself in sitting position; continued to be lethargic sitting EOB  Transfers General transfer comment: pt more lethargic and disengaged in therapy today; will require lift for safety         PT Diagnosis:    PT Problem List:   PT Treatment Interventions:     PT Goals (current goals can now be found in the care plan section) Acute Rehab PT Goals Patient Stated Goal: none stated PT Goal Formulation: With patient/family Time For Goal Achievement: 12/15/13 Potential to Achieve Goals: Good  Visit Information  Last PT Received On: 12/05/13 Assistance Needed: +2 History of Present Illness: 53 y/o M admitted on 1/13 after abnormal MRI/CT of brain with findings of anterior thrid ventricular tumor.  Underwent R craniotomy for resection / biopsy on 1/14. Pt is s/p Craniotomy with tumor resection.      Subjective Data  Subjective: pt lying supine with Lt eye open but is snoring Patient Stated Goal: none stated   Cognition  Cognition Arousal/Alertness: Lethargic Behavior During Therapy: Flat affect Overall Cognitive Status: Impaired/Different from baseline Area of Impairment: Orientation;Awareness;Problem solving;Following commands;Attention Orientation Level: Disoriented to;Situation Memory: Decreased short-term memory Following Commands: Follows one step commands with increased time;Follows one step commands inconsistently Safety/Judgement: Decreased awareness of deficits Awareness: Emergent Problem Solving: Slow processing;Requires verbal cues;Requires tactile cues;Difficulty sequencing;Decreased initiation General Comments: pt  more lethargic today and less aroused; unable to understand all  verbalizations today; pt with eyes closed throughout session; responded inconsistently to pain on shoulders and [name]     Balance  Balance Overall balance assessment: Needs assistance Sitting-balance support: Feet supported (unable to support UEs ) Sitting balance-Leahy Scale: Zero Sitting balance - Comments: pt required max posterior support today to tolerate sititng EOB; pt inconsistent with eyes opening and arousal; attempted multiple strategies to increase UE WBing for pt to be able to support himself; pt unable to bear weight through his UEs today; tolerated sitting EOB ~8 min with max support; pt with Rt cervical rotation and left sidebend; RN present during EOB activities; pt unable to achieve cervical extension today with max facilitation through shoulders and cervical retractor muscles  Postural control: Posterior lean  End of Session PT - End of Session Equipment Utilized During Treatment: Gait belt Activity Tolerance: Patient limited by lethargy Patient left: in bed;with call bell/phone within reach Nurse Communication: Mobility status;Precautions   GP     Gustavus Bryant, [place] 12/05/2013, 4:28 PM

## 2013-12-05 NOTE — Progress Notes (Signed)
Dr. Chase Caller was notified of ABG results.  Per RN, MD wants pt to go on CPAP for now, but then CPAP qhs.  Pt placed on cpap 12 auto titrate.  Pt tol well, no distress noted, VSS.

## 2013-12-05 NOTE — Progress Notes (Signed)
SLP Cancellation Note  Patient Details Name: Danny Suarez MRN: [id number] DOB: 11-12-1961   Cancelled treatment: F/u by ST to reassess swallow to determine PO readiness but treatment cancelled as patient continues to be obtunded with inability to participate fully.  Will f/u for POC on 12/06/13. Sharman Crate [place], [place] [facility] 12/05/2013, 10:32 AM

## 2013-12-05 NOTE — Progress Notes (Addendum)
Name: Danny Suarez MRN: [id number] DOB: [birth year]    ADMISSION DATE:  11-29-2013 CONSULTATION DATE:  11/28/12  REFERRING MD :  Dr. Kathyrn Sheriff PRIMARY SERVICE: NSGY  CHIEF COMPLAINT:  Resp Failure  BRIEF PATIENT DESCRIPTION: 53 y/o M admitted on 1/13 after abnormal MRI/CT of brain with findings of anterior thrid ventricular tumor.  Underwent R craniotomy for resection / biopsy on 1/14.  Returned to ICU on vent.  Minimally responsive post-op.  SIGNIFICANT EVENTS / STUDIES:  1/13 - Admit for planned craniotomy after abnormal MRI / CT 1/14 - Craniotomy with tumor resection / bx per Dr. Kathyrn Sheriff 12/01/13: ery somnolent but wakes up with brisk stimulation.  Bradycardic overnight with normal BP. 12/02/13: a little bit more responsive v same as of 24h ago. Remains extubated. Can protect airway somewhat 12/03/13: Still somnolent but some more awake compared to 24h earlier. PCCM signed off   LINES / TUBES: OETT 1/15>>>1/17  CULTURES / PATHOLOGY: Tumor Path 1/14>>> GLIOBLASTOMA MULTIFORME 4  ANTIBIOTICS: Ancef post OR 1/14>>1/15  SUBJECTIVE:   1/22/1/5: Nursing concerned about potential sleep apnea and PCCM asked to see. Tongue falling back and hjad apnea spells bujt overnight but did not desaturate. This morning more smnolent than prior 24h. He does use CPAP at night   VITAL SIGNS: Temp:  [97.5 F (36.4 C)-98.7 F (37.1 C)] 98.3 F (36.8 C) (01/22 0728) Pulse Rate:  [43-56] 48 (01/22 0900) Resp:  [0-19] 15 (01/22 0900) BP: (121-138)/(58-83) 134/62 mmHg (01/22 0900) SpO2:  [92 %-100 %] 99 % (01/22 0900) Weight:  [128.7 kg (283 lb 11.7 oz)] 128.7 kg (283 lb 11.7 oz) (01/22 0300)   INTAKE / OUTPUT: Intake/Output     01/21 0701 - 01/22 0700 01/22 0701 - 01/23 0700   I.V. (mL/kg) 460 (3.6) 80 (0.6)   NG/GT 1632.5 240   IV Piggyback 210    Total Intake(mL/kg) 2302.5 (17.9) 320 (2.5)   Urine (mL/kg/hr) 1840 (0.6) 175 (0.5)   Total Output 1840 175   Net +462.5 +145          PHYSICAL EXAMINATION: General:  wdwn adult male in NAD. Neuro: RASS -2/-3 equivalent. Follows commands but less arousable. Moves 4s. Seems to understand.  HEENT: Post crani, mm pink/moist, no JVD, swelling of R orbital tissues, reactivity of pupils sluggish. Tounge falling back Cardiovascular:  s1s2 rrr, no m/r/g, occ brady high 50s Lungs:  resp's even/non-labored, lungs bilaterally clear Abdomen:  Round/soft, bsx4 active, OGT  Musculoskeletal:  L TKR incision site healing with steri strips intact, no s/s of infection Skin:  Warm/dry, +1 generalized edema   LABS: PULMONARY No results found for this basename: PHART, PCO2, PCO2ART, PO2, PO2ART, HCO3, TCO2, O2SAT,  in the last 168 hours  CBC  Recent Labs Lab 11/29/13 0500 12/01/13 0520 12/02/13 0415  HGB 11.6* 11.3* 11.9*  HCT 34.8* 33.7* 35.1*  WBC 18.5* 11.0* 9.3  PLT 323 308 315    COAGULATION No results found for this basename: INR,  in the last 168 hours  CARDIAC  No results found for this basename: TROPONINI,  in the last 168 hours No results found for this basename: PROBNP,  in the last 168 hours   CHEMISTRY  Recent Labs Lab 11/28/13 1300  11/29/13 0500 12/01/13 0520 12/02/13 0415  NA 143  --  140 145 143  K  --   < > 4.3 4.3 4.3  CL  --   --  102 104 103  CO2  --   --  27 27 26   GLUCOSE  --   --  186* 142* 190*  BUN  --   --  18 20 33*  CREATININE  --   --  0.85 0.77 0.81  CALCIUM  --   --  8.6 9.0 9.0  MG  --   --   --  2.2 2.4  PHOS  --   --   --  3.6 3.8  < > = values in this interval not displayed. Estimated Creatinine Clearance: 153.8 ml/min (by C-G formula based on Cr of 0.81).   LIVER No results found for this basename: AST, ALT, ALKPHOS, BILITOT, PROT, ALBUMIN, INR,  in the last 168 hours   INFECTIOUS No results found for this basename: LATICACIDVEN, PROCALCITON,  in the last 168 hours   ENDOCRINE CBG (last 3)   Recent Labs  12/04/13 2356 12/05/13 0413 12/05/13 0727  GLUCAP 163*  167* 183*         IMAGING x48h  No results found.     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  Not classic cushing triad, concern for increase ICH, will defer to neurosurg whether to CT or not.  12/05/13: A bit more smnolent despite no benzo/opioids x > 48h  P:   - Rec' s per NSGY. - Decadron taper per NSGY - dc fent prn - Keppra for seizure prophylaxis. - Will need swallow evaluation when more awake.  PULMONARY A: Known OSA on CPAP at home Acute Respiratory Failure> respiratory mechanics favorable for extubation, but mental status limits  12/05/13: s.p exrtubation 4d ago. Holding ground but concern for OSA  P:   -Check abg ; if normal do cpap, if hypercapnic do bipap - daytime prn +  Nocturanl bipap/cpap - - Titrate o2 for sats. - Mobilize once more responsive.  CARDIOVASCULAR A:  Normotension  P:  - ICU monitoring. - Continue IVF until able to take PO. - D/C labetalol since 1/18.  RENAL A:   No acute issues  P:   - Monitor renal fxn.  GASTROINTESTINAL A:    PPI. Nutirition. P:   - Protonix. - Insert NGT. - Restart TF per nutrition  HEMATOLOGIC A:   Mild Anemia W[place] at home prior to surgery (DVT proph post knee replacement?) P:  - Monitor H/H trend. - SCD's for DVT proph. - Medical DVT prophylaxis when OK by NSGY. - PRBC for hgb < 7gm%  INFECTIOUS A:   Post-Op Craniotomy  P:   - Abx per NSGY off.  ENDOCRINE A:   At Risk Hyper / Hypoglycemia  P:   -CBG's Q6 while on on continuous feeds.   Global: keep in ICU. Mental status coma makes him ICU candidate. PCCM will re-reound due to OSA concerns    Dr. Brand Males, M.D., [facility].C.P Pulmonary and Critical Care Medicine Staff Physician [facility] Pager: [phone], If no answer or between  15:00h - 7:00h: call [phone]  12/05/2013 9:54 AM

## 2013-12-05 NOTE — Progress Notes (Signed)
Notified Dr. Chase Caller of ABG results, Bipap orders discontinued and changed to CPAP at night and PRN.

## 2013-12-05 NOTE — Progress Notes (Signed)
Pt seen and examined. Pt continues to snore loudly at night, with brief episodes of apnea (known hx of OSA).  EXAM: Temp:  [97.5 F (36.4 C)-98.7 F (37.1 C)] 98.3 F (36.8 C) (01/22 0728) Pulse Rate:  [38-56] 45 (01/22 0800) Resp:  [0-19] 12 (01/22 0800) BP: (121-138)/(58-83) 133/75 mmHg (01/22 0800) SpO2:  [92 %-100 %] 100 % (01/22 0800) Weight:  [128.7 kg (283 lb 11.7 oz)] 128.7 kg (283 lb 11.7 oz) (01/22 0300) Intake/Output     01/21 0701 - 01/22 0700 01/22 0701 - 01/23 0700   I.V. 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 male POD# 8 s/p crani for 3rd ventricular GBM, neurologically stable - sleepiness may in part be due to OSA  PLAN: - start CPAP - Cont observation, can attempt another sleep study if awake enough

## 2013-12-06 LAB — GLUCOSE, CAPILLARY
GLUCOSE-CAPILLARY: 149 mg/dL — AB (ref 70–99)
GLUCOSE-CAPILLARY: 152 mg/dL — AB (ref 70–99)
Glucose-Capillary: 161 mg/dL — ABNORMAL HIGH (ref 70–99)
Glucose-Capillary: 184 mg/dL — ABNORMAL HIGH (ref 70–99)
Glucose-Capillary: 121 mg/dL — ABNORMAL HIGH (ref 70–99)

## 2013-12-06 LAB — BLOOD GAS, ARTERIAL
Acid-Base Excess: 2.6 mmol/L — ABNORMAL HIGH (ref 0.0–2.0)
Bicarbonate: 26.3 mEq/L — ABNORMAL HIGH (ref 20.0–24.0)
Delivery systems: POSITIVE
Drawn by: 24486
EXPIRATORY PAP: 5
FIO2: 0.4 %
Inspiratory PAP: 10
O2 SAT: 99.7 %
PH ART: 7.456 — AB (ref 7.350–7.450)
PO2 ART: 153 mmHg — AB (ref 80.0–100.0)
Patient temperature: 98
TCO2: 27.5 mmol/L (ref 0–100)
pCO2 arterial: 37.8 mmHg (ref 35.0–45.0)

## 2013-12-06 MED ORDER — VITAL HIGH PROTEIN PO LIQD
1000.0000 mL | ORAL | Status: AC
  Administered 2013-12-06 – 2013-12-12 (×10): 1000 mL
  Filled 2013-12-06 (×17): qty 1000

## 2013-12-06 NOTE — Progress Notes (Signed)
Pt seen and examined. No issues overnight. Pt remains on BiPAP  EXAM: Temp:  [97.4 F (36.3 C)-98.4 F (36.9 C)] 98 F (36.7 C) (01/23 1200) Pulse Rate:  [26-62] 58 (01/23 1400) Resp:  [10-19] 14 (01/23 1400) BP: (94-143)/(59-92) 115/73 mmHg (01/23 1400) SpO2:  [95 %-100 %] 95 % (01/23 1400) FiO2 (%):  [40 %] 40 % (01/23 1400) Weight:  [129.2 kg (284 lb 13.4 oz)] 129.2 kg (284 lb 13.4 oz) (01/23 0433) Intake/Output     01/22 0701 - 01/23 0700 01/23 0701 - 01/24 0700   I.V. (mL/kg) 520 (4) 140 (1.1)   Other 296    NG/GT 2000 590   IV Piggyback 210 105   Total Intake(mL/kg) 3026 (23.4) 835 (6.5)   Urine (mL/kg/hr) 1755 (0.6) 665 (0.6)   Total Output 1755 665   Net +1271 +170        Stool Occurrence  1 x    Easily arousable by voice Says name Follows commands BUE/BLE Wound c/d/i  LABS: Lab Results  Component Value Date   CREATININE 0.81 12/02/2013   BUN 33* 12/02/2013   NA 143 12/02/2013   K 4.3 12/02/2013   CL 103 12/02/2013   CO2 26 12/02/2013   Lab Results  Component Value Date   WBC 9.3 12/02/2013   HGB 11.9* 12/02/2013   HCT 35.1* 12/02/2013   MCV 82.8 12/02/2013   PLT 315 12/02/2013    IMPRESSION: - 53 y.o. male POD# 9 s/p crani for debulking of GBM, neurologically stable with continued drowsiness, likely related to surgery and hx of OSA  PLAN: - Cont current mgmt with BiPAP - If no significant improvement in LOC in next few days, may consider PEG

## 2013-12-06 NOTE — Progress Notes (Signed)
Chaplain encountered pt's wife and brother-in-law in [facility] hallway needing directions. Chaplain walked them to financial counselors' office on first floor. Chaplain listened empathically to her concerns, provided emotional and spiritual support, and a caring presence.   Encountered family again later in [facility] waiting. Family said that pt was "much better" and they were going to get food. Chaplain provided empathic listening, caring presence, and emotional/spiritual support.   Will refer to unit Chaplain for Monday morning. Please page on-call chaplain over the weekend if additional support is needed.   Felix Pacini [place] On-Call: [phone]

## 2013-12-06 NOTE — Progress Notes (Signed)
Pt is becoming more difficult to arouse and is taking longer for him to follow commands. Pt is currently on Bipap. Notified Dr. Chase Caller; STAT ABG ordered. Will continue to monitor.

## 2013-12-06 NOTE — Progress Notes (Signed)
Speech Language Pathology Discharge Patient Details Name: Danny Suarez MRN: [id number] DOB: [birth year] Today's Date: 12/06/2013 Time:  -     Patient discharged from SLP services secondary to inability to assess since 1/17 due to pt. Obtunded.  Presently on CPAP.  Please reconsult if/when appropriate.  Please see latest therapy progress note for current level of functioning and progress toward goals.    Progress and discharge plan discussed with patient and/or caregiver: Pt. obtunded  GO     Danny Suarez M.Ed [employer] [phone]  12/06/2013

## 2013-12-06 NOTE — Progress Notes (Signed)
Name: Danny Suarez MRN: [id number] DOB: 1961/05/20    ADMISSION DATE:  11/29/2013 CONSULTATION DATE:  11/28/12  REFERRING MD :  Dr. Kathyrn Sheriff PRIMARY SERVICE: NSGY  CHIEF COMPLAINT:  Resp Failure  BRIEF PATIENT DESCRIPTION: 53 y/o M admitted on 1/13 after abnormal MRI/CT of brain with findings of anterior thrid ventricular tumor.  Underwent R craniotomy for resection / biopsy on 1/14.  Returned to ICU on vent.  Minimally responsive post-op.  SIGNIFICANT EVENTS / STUDIES:  1/13 - Admit for planned craniotomy after abnormal MRI / CT 1/14 - Craniotomy with tumor resection / bx per Dr. Kathyrn Sheriff 12/01/13: ery somnolent but wakes up with brisk stimulation.  Bradycardic overnight with normal BP. 12/02/13: a little bit more responsive v same as of 24h ago. Remains extubated. Can protect airway somewhat 12/03/13: Still somnolent but some more awake compared to 24h earlier. PCCM signed off 12/05/13: start cpap due to somnolence and OSA  LINES / TUBES: OETT 1/15>>>1/17  CULTURES / PATHOLOGY: Tumor Path 1/14>>> GLIOBLASTOMA MULTIFORME 4  ANTIBIOTICS: Ancef post OR 1/14>>1/15  SUBJECTIVE:   12/06/13: still somnolent despite cpap. ? Some worse  VITAL SIGNS: Temp:  [97.4 F (36.3 C)-98.4 F (36.9 C)] 98.4 F (36.9 C) (01/23 0700) Pulse Rate:  [26-60] 60 (01/23 0900) Resp:  [10-19] 16 (01/23 0900) BP: (94-143)/(59-86) 115/76 mmHg (01/23 0900) SpO2:  [95 %-100 %] 98 % (01/23 0900) Weight:  [129.2 kg (284 lb 13.4 oz)] 129.2 kg (284 lb 13.4 oz) (01/23 0433)   INTAKE / OUTPUT: Intake/Output     01/22 0701 - 01/23 0700 01/23 0701 - 01/24 0700   I.V. (mL/kg) 520 (4) 40 (0.3)   Other 296    NG/GT 2000 80   IV Piggyback 210    Total Intake(mL/kg) 3026 (23.4) 120 (0.9)   Urine (mL/kg/hr) 1755 (0.6) 185 (0.5)   Total Output 1755 185   Net +1271 -65         PHYSICAL EXAMINATION: General:  wdwn adult male in NAD. Neuro: RASS -2/-3 equivalent. Follows commands but less arousable.  Moves 4s. Seems to understand.  HEENT: Post crani, mm pink/moist, no JVD, swelling of R orbital tissues, reactivity of pupils sluggish. Tounge falling back Cardiovascular:  s1s2 rrr, no m/r/g, occ brady high 50s Lungs:  resp's even/non-labored, lungs bilaterally clear Abdomen:  Round/soft, bsx4 active, OGT  Musculoskeletal:  L TKR incision site healing with steri strips intact, no s/s of infection Skin:  Warm/dry, +1 generalized edema   LABS: PULMONARY  Recent Labs Lab 12/05/13 1014  PHART 7.429  PCO2ART 39.0  PO2ART 75.0*  HCO3 25.8*  TCO2 27  O2SAT 95.0    CBC  Recent Labs Lab 12/01/13 0520 12/02/13 0415  HGB 11.3* 11.9*  HCT 33.7* 35.1*  WBC 11.0* 9.3  PLT 308 315    COAGULATION No results found for this basename: INR,  in the last 168 hours  CARDIAC  No results found for this basename: TROPONINI,  in the last 168 hours No results found for this basename: PROBNP,  in the last 168 hours   CHEMISTRY  Recent Labs Lab 12/01/13 0520 12/02/13 0415  NA 145 143  K 4.3 4.3  CL 104 103  CO2 27 26  GLUCOSE 142* 190*  BUN 20 33*  CREATININE 0.77 0.81  CALCIUM 9.0 9.0  MG 2.2 2.4  PHOS 3.6 3.8   Estimated Creatinine Clearance: 154.1 ml/min (by C-G formula based on Cr of 0.81).   LIVER No results found for  this basename: AST, ALT, ALKPHOS, BILITOT, PROT, ALBUMIN, INR,  in the last 168 hours   INFECTIOUS No results found for this basename: LATICACIDVEN, PROCALCITON,  in the last 168 hours   ENDOCRINE CBG (last 3)   Recent Labs  12/05/13 2250 12/06/13 0319 12/06/13 0733  GLUCAP 154* 161* 149*         IMAGING x48h  No results found.     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  Not classic cushing triad, concern for increase ICH, will defer to neurosurg whether to CT or not.  12/06/13: A bit more smnolent despite no  benzo/opioids x > 48h and despite CPAP x 24h  P:   - Rec' s per NSGY. - Decadron taper per NSGY -? Need repeat CT head; dfer to [place] for seizure prophylaxis. - Will need swallow evaluation when more awake.  PULMONARY A: Known OSA on CPAP at home Acute Respiratory Failure> respiratory mechanics favorable for extubation, but mental status limits  12/06/13: s.p exrtubation 5d ago. Holding ground but concern for OSA and ? Tiring out  P:   -[place] cpap to bipap qhs + datyime 2h on / 4h off  - Intubate if worse (what is goals of care here? , wife never seen in AM)  CARDIOVASCULAR A:  Normotension  P:  - ICU monitoring. - Continue IVF until able to take PO. - D/C labetalol since 1/18.  RENAL A:   No acute issues  P:   - Monitor renal fxn.  GASTROINTESTINAL A:    PPI. Nutirition. P:   - Protonix. -NGT. -TF per nutrition  HEMATOLOGIC A:   Mild Anemia Wafarin at home prior to surgery (DVT proph post knee replacement?) P:  - Monitor H/H trend. - SCD's for DVT proph. - Medical DVT prophylaxis when OK by NSGY. - PRBC for hgb < 7gm%  INFECTIOUS A:   Post-Op Craniotomy  P:   - Abx per NSGY off.  ENDOCRINE A:   At Risk Hyper / Hypoglycemia  P:   -CBG's Q6 while on on continuous feeds.   Global: keep in ICU. Mental status coma makes him ICU candidate. PCCM will re-reound due to OSA concerns and airway concerns. He ssems a bit worse and is at high risk for reintubation. Goals of care need established  The patient is critically ill with multiple organ systems failure and requires high complexity decision making for assessment and support, frequent evaluation and titration of therapies, application of advanced monitoring technologies and extensive interpretation of multiple databases.   Critical Care Time devoted to patient care services described in this note is  35  Minutes.  Dr. Brand Males, M.D., [facility].C.P Pulmonary and Critical Care Medicine Staff  Physician [place] [facility] Pager: [phone], If no answer or between  15:00h - 7:00h: call [phone]  12/06/2013 10:11 AM         Dr. Brand Males, M.D., [facility].C.P Pulmonary and Critical Care Medicine Staff Physician [place] [facility] Pager: [phone], If no answer or between  15:00h - 7:00h: call [phone]  12/06/2013 10:00 AM

## 2013-12-06 NOTE — Progress Notes (Signed)
Reviewed ABG results. Notified Dr. Chase Caller of results; no new orders received. Will continue to monitor.

## 2013-12-06 NOTE — Progress Notes (Signed)
Patient placed on CPAP at this time. Tolerating well, RT will continue to monitor. 

## 2013-12-06 NOTE — Progress Notes (Signed)
NUTRITION FOLLOW UP  Intervention:    1. Increase Vital High Protein to 85 ml/hr.   Tube feeding regimen provides 2040 kcal, 178 grams of protein, and 1705 ml of H2O.   NUTRITION DIAGNOSIS:  Inadequate oral intake related to inability to eat as evidenced by NPO status; ongoing.   Goal:  Enteral nutrition to provide 22-25 kcals/kg ideal body weight and 100% of estimated protein needs, based on ASPEN guidelines for permissive underfeeding in critically ill obese individuals; met.   Monitor:  TF initiation and tolerance, weight trend, labs  Assessment:   Pt admitted due to abnormal MRI and CT of his brain 1/13. 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 Pt had craniotomy for resection/biopsy on 1/14. .   Per MRI 1/16 pt with acute strokes (right basal ganglia, right thalamus, and inferior right frontal lobe). Pt extubated 1/18   Pt remains obtunded per RN lethargy is increased today.  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 Neurosurgery following.   TF: Vital High Protein @ 80 m/hr.   Tube feeding regimen provides 1920 kcal, 168 grams of protein, and 1605 ml of H2O   Height: Ht Readings from Last 1 Encounters:  11/20/2013 _0  (1.88 m)    Weight Status:   Wt Readings from Last 1 Encounters:  12/06/13 284 lb 13.4 oz (129.2 kg)  Admission weight 299 lb (135.6 kg)  Re-estimated needs:  Kcal: 1900-2100 Protein: >/= 172 grams  Fluid: > 2 L/day  Skin: head and knee incisions  Diet Order:     Intake/Output Summary (Last 24 hours) at 12/06/13 1531 Last data filed at 12/06/13 1400  Gross per 24 hour  Intake   2836 ml  Output   1740 ml  Net   1096 ml    Last BM: 1/23  Labs:   Recent Labs Lab 12/01/13 0520 12/02/13 0415  NA 145 143  K 4.3 4.3  CL 104 103  CO2 27 26  BUN 20 33*  CREATININE 0.77 0.81  CALCIUM 9.0 9.0  MG 2.2 2.4  PHOS 3.6 3.8  GLUCOSE 142* 190*    CBG (last 3)   Recent Labs  12/06/13 0319  12/06/13 0733 12/06/13 1127  GLUCAP 161* 149* 184*    Scheduled Meds: . antiseptic oral rinse  15 mL Mouth Rinse QID  . cholecalciferol  400 Units Oral Daily  . dexamethasone  4 mg Intravenous Q8H  . docusate  100 mg Oral BID  . insulin aspart  0-20 Units Subcutaneous Q4H  . levETIRAcetam  500 mg Intravenous Q12H  . pantoprazole (PROTONIX) IV  40 mg Intravenous Daily    Continuous Infusions: . sodium chloride 20 mL/hr at 12/06/13 1400  . feeding supplement (VITAL HIGH PROTEIN) 1,000 mL (12/06/13 1400)    [place], [place], [place] Pager [phone] After Hours Pager

## 2013-12-07 ENCOUNTER — Inpatient Hospital Stay (HOSPITAL_COMMUNITY): Payer: BC Managed Care – PPO

## 2013-12-07 LAB — CBC WITH DIFFERENTIAL/PLATELET
BASOS PCT: 0 % (ref 0–1)
Basophils Absolute: 0 10*3/uL (ref 0.0–0.1)
EOS ABS: 0.1 10*3/uL (ref 0.0–0.7)
EOS PCT: 1 % (ref 0–5)
HCT: 39.2 % (ref 39.0–52.0)
Hemoglobin: 12.8 g/dL — ABNORMAL LOW (ref 13.0–17.0)
Lymphocytes Relative: 19 % (ref 12–46)
Lymphs Abs: 2.3 10*3/uL (ref 0.7–4.0)
MCH: 27.6 pg (ref 26.0–34.0)
MCHC: 32.7 g/dL (ref 30.0–36.0)
MCV: 84.5 fL (ref 78.0–100.0)
Monocytes Absolute: 0.8 10*3/uL (ref 0.1–1.0)
Monocytes Relative: 7 % (ref 3–12)
NEUTROS PCT: 74 % (ref 43–77)
Neutro Abs: 9.3 10*3/uL — ABNORMAL HIGH (ref 1.7–7.7)
Platelets: 309 10*3/uL (ref 150–400)
RBC: 4.64 MIL/uL (ref 4.22–5.81)
RDW: 14 % (ref 11.5–15.5)
WBC: 12.5 10*3/uL — ABNORMAL HIGH (ref 4.0–10.5)

## 2013-12-07 LAB — BLOOD GAS, ARTERIAL
Acid-Base Excess: 5 mmol/L — ABNORMAL HIGH (ref 0.0–2.0)
Bicarbonate: 28.8 mEq/L — ABNORMAL HIGH (ref 20.0–24.0)
Drawn by: 10552
O2 Content: 3 L/min
O2 Saturation: 98.4 %
Patient temperature: 98.6
TCO2: 30.1 mmol/L (ref 0–100)
pCO2 arterial: 41.2 mmHg (ref 35.0–45.0)
pH, Arterial: 7.459 — ABNORMAL HIGH (ref 7.350–7.450)
pO2, Arterial: 98.5 mmHg (ref 80.0–100.0)

## 2013-12-07 LAB — BASIC METABOLIC PANEL
BUN: 34 mg/dL — AB (ref 6–23)
CO2: 28 mEq/L (ref 19–32)
CREATININE: 0.73 mg/dL (ref 0.50–1.35)
Calcium: 8.7 mg/dL (ref 8.4–10.5)
Chloride: 101 mEq/L (ref 96–112)
Glucose, Bld: 141 mg/dL — ABNORMAL HIGH (ref 70–99)
Potassium: 4.4 mEq/L (ref 3.7–5.3)
Sodium: 141 mEq/L (ref 137–147)

## 2013-12-07 LAB — GLUCOSE, CAPILLARY
GLUCOSE-CAPILLARY: 146 mg/dL — AB (ref 70–99)
Glucose-Capillary: 122 mg/dL — ABNORMAL HIGH (ref 70–99)
Glucose-Capillary: 145 mg/dL — ABNORMAL HIGH (ref 70–99)
Glucose-Capillary: 167 mg/dL — ABNORMAL HIGH (ref 70–99)
Glucose-Capillary: 179 mg/dL — ABNORMAL HIGH (ref 70–99)
Glucose-Capillary: 182 mg/dL — ABNORMAL HIGH (ref 70–99)

## 2013-12-07 LAB — PRO B NATRIURETIC PEPTIDE: PRO B NATRI PEPTIDE: 14.8 pg/mL (ref 0–125)

## 2013-12-07 LAB — PHOSPHORUS: PHOSPHORUS: 3.5 mg/dL (ref 2.3–4.6)

## 2013-12-07 LAB — MAGNESIUM: Magnesium: 2.3 mg/dL (ref 1.5–2.5)

## 2013-12-07 MED ORDER — DEXAMETHASONE SODIUM PHOSPHATE 4 MG/ML IJ SOLN
4.0000 mg | Freq: Two times a day (BID) | INTRAMUSCULAR | Status: AC
  Administered 2013-12-07 – 2013-12-08 (×3): 4 mg via INTRAVENOUS
  Filled 2013-12-07 (×2): qty 1

## 2013-12-07 NOTE — Progress Notes (Signed)
Name: Danny Suarez MRN: [id number] DOB: Apr 06, 1961    ADMISSION DATE:  01/02/2014 CONSULTATION DATE:  11/28/12  REFERRING MD :  Dr. Kathyrn Sheriff PRIMARY SERVICE: NSGY  CHIEF COMPLAINT:  Resp Failure  BRIEF PATIENT DESCRIPTION: 53 y/o M admitted on 1/13 after abnormal MRI/CT of brain with findings of anterior thrid ventricular tumor.  Underwent R craniotomy for resection / biopsy on 1/14.  Returned to ICU on vent.  Minimally responsive post-op.  SIGNIFICANT EVENTS / STUDIES:  1/13 - Admit for planned craniotomy after abnormal MRI / CT 1/14 - Craniotomy with tumor resection / bx per Dr. Kathyrn Sheriff 12/01/13: ery somnolent but wakes up with brisk stimulation.  Bradycardic overnight with normal BP. 12/02/13: a little bit more responsive v same as of 24h ago. Remains extubated. Can protect airway somewhat 12/03/13: Still somnolent but some more awake compared to 24h earlier. PCCM signed off 12/05/13: start cpap due to somnolence and OSA  LINES / TUBES: OETT 1/15>>>1/17  CULTURES / PATHOLOGY: Tumor Path 1/14>>> GLIOBLASTOMA MULTIFORME 4  ANTIBIOTICS: Ancef post OR 1/14>>1/15  SUBJECTIVE:    VITAL SIGNS: Temp:  [98 F (36.7 C)-98.7 F (37.1 C)] 98.7 F (37.1 C) (01/24 0347) Pulse Rate:  [51-86] 75 (01/24 0800) Resp:  [14-28] 17 (01/24 0800) BP: (94-150)/(64-98) 119/85 mmHg (01/24 0800) SpO2:  [85 %-100 %] 99 % (01/24 0800) FiO2 (%):  [40 %] 40 % (01/24 0438) Weight:  [128.2 kg (282 lb 10.1 oz)] 128.2 kg (282 lb 10.1 oz) (01/24 0414) Vent Mode:  [-] BIPAP FiO2 (%):  [40 %] 40 % Set Rate:  [10 bmp] 10 bmp PEEP:  [5 cmH20] 5 cmH20 Pressure Support:  [10 cmH20] 10 cmH20 INTAKE / OUTPUT: Intake/Output     01/23 0701 - 01/24 0700 01/24 0701 - 01/25 0700   I.V. (mL/kg) 480 (3.7) 20 (0.2)   Other     NG/GT 2025 85   IV Piggyback 210    Total Intake(mL/kg) 2715 (21.2) 105 (0.8)   Urine (mL/kg/hr) 1840 (0.6) 65 (0.2)   Total Output 1840 65   Net +875 +40        Stool  Occurrence 1 x     PHYSICAL EXAMINATION: General:  wdwn adult male in NAD. Neuro: RASS -1 equivalent. Follows commands but less arousable. Moves 4s. Seems to understand.  HEENT: Post crani, mm pink/moist, no JVD, swelling of R orbital tissues, reactivity of pupils sluggish. Tounge falling back Cardiovascular:  s1s2 rrr, no m/r/g, occ brady high 50s Lungs:  resp's even/non-labored, lungs bilaterally clear Abdomen:  Round/soft, bsx4 active, OGT  Musculoskeletal:  L TKR incision site healing with steri strips intact, no s/s of infection Skin:  Warm/dry, +1 generalized edema   LABS: PULMONARY  Recent Labs Lab 12/05/13 1014 12/06/13 1058  PHART 7.429 7.456*  PCO2ART 39.0 37.8  PO2ART 75.0* 153.0*  HCO3 25.8* 26.3*  TCO2 27 27.5  O2SAT 95.0 99.7    CBC  Recent Labs Lab 12/01/13 0520 12/02/13 0415 12/07/13 0500  HGB 11.3* 11.9* 12.8*  HCT 33.7* 35.1* 39.2  WBC 11.0* 9.3 12.5*  PLT 308 315 309    COAGULATION No results found for this basename: INR,  in the last 168 hours  CARDIAC  No results found for this basename: TROPONINI,  in the last 168 hours  Recent Labs Lab 12/07/13 0500  PROBNP 14.8     CHEMISTRY  Recent Labs Lab 12/01/13 0520 12/02/13 0415 12/07/13 0500  NA 145 143 141  K 4.3 4.3 4.4  CL 104 103 101  CO2 27 26 28   GLUCOSE 142* 190* 141*  BUN 20 33* 34*  CREATININE 0.77 0.81 0.73  CALCIUM 9.0 9.0 8.7  MG 2.2 2.4 2.3  PHOS 3.6 3.8 3.5   Estimated Creatinine Clearance: 155.4 ml/min (by C-G formula based on Cr of 0.73).   LIVER No results found for this basename: AST, ALT, ALKPHOS, BILITOT, PROT, ALBUMIN, INR,  in the last 168 hours   INFECTIOUS No results found for this basename: LATICACIDVEN, PROCALCITON,  in the last 168 hours   ENDOCRINE CBG (last 3)   Recent Labs  12/06/13 2332 12/07/13 0350 12/07/13 0753  GLUCAP 167* 179* 182*         IMAGING x48h  Dg Chest Port 1 View  12/07/2013   CLINICAL DATA:  Respiratory  failure.  EXAM: PORTABLE CHEST - 1 VIEW  COMPARISON:  11/20/2013  FINDINGS: Feeding tube is in the body of the stomach with the tip in the fundus. Central line is in good position. Endotracheal tube has been removed.  Heart size and pulmonary vascularity are normal and the lungs are clear.  IMPRESSION: No acute abnormality. Interval removal of the endotracheal tube and replacement of the NG tube with a feeding tube. Feeding tube tip is in the fundus of the stomach.   Electronically Signed   By: Rozetta Nunnery M.D.   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  Not classic cushing triad, concern for increase ICH Hypersomnulence  P:   - Rec' s per NSGY. - Decadron taper per NSGY -? Need repeat CT head; dfer to [place] for seizure prophylaxis. - Will need swallow evaluation when more awake.  PULMONARY A: Known OSA on CPAP at home Acute Respiratory Failure > resolved  P:   -Continue bipap qhs + datyime 2h on / 4h off -check ABG -add NTS -will need to discuss goals with his wife, ? Would he want to be reintubated if needed  CARDIOVASCULAR A:  Normotension  - off labetalol since 1/18. P:  - ICU monitoring. - Continue IVF until able to take PO.  RENAL A:   No acute issues  P:   - Monitor renal fxn.  GASTROINTESTINAL A:    PPI. Nutirition. P:   - Protonix. -NGT. -TF per nutrition  HEMATOLOGIC A:   Mild Anemia Wafarin at home prior to surgery (DVT proph post knee replacement?) P:   - Monitor H/H trend. - SCD's for DVT proph. - Medical DVT prophylaxis when OK by NSGY. - PRBC for hgb < 7gm%  INFECTIOUS A:   Post-Op Craniotomy  P:   - Abx per NSGY > now off.  ENDOCRINE A:   At Risk Hyper / Hypoglycemia  P:   -CBG's Q4 while on on continuous feeds.   Global: remains very somnolent, tenuous mental and resp status. Continue  ICU monitoring. Goals of care need established  The patient is critically ill with multiple organ systems failure and requires high complexity decision making for assessment and support, frequent evaluation and titration of therapies, application of advanced monitoring technologies and extensive interpretation of multiple databases.   Critical Care Time devoted to patient care services described in this note is  35  Minutes.  Baltazar Apo, MD, PhD 12/07/2013, 9:26 AM [place] [facility] [phone] or if no answer [phone]

## 2013-12-07 NOTE — Progress Notes (Signed)
Pt seen and examined. No issues overnight. Pt more awake, attempts to speak but unable to discern words.  EXAM: Temp:  [98 F (36.7 C)-98.7 F (37.1 C)] 98.7 F (37.1 C) (01/24 0347) Pulse Rate:  [51-86] 65 (01/24 0900) Resp:  [14-28] 18 (01/24 0900) BP: (94-150)/(64-98) 129/77 mmHg (01/24 0900) SpO2:  [85 %-100 %] 95 % (01/24 0900) FiO2 (%):  [40 %] 40 % (01/24 0438) Weight:  [128.2 kg (282 lb 10.1 oz)] 128.2 kg (282 lb 10.1 oz) (01/24 0414) Intake/Output     01/23 0701 - 01/24 0700 01/24 0701 - 01/25 0700   I.V. (mL/kg) 480 (3.7) 20 (0.2)   Other     NG/GT 2025 85   IV Piggyback 210    Total Intake(mL/kg) 2715 (21.2) 105 (0.8)   Urine (mL/kg/hr) 1840 (0.6) 65 (0.2)   Total Output 1840 65   Net +875 +40        Stool Occurrence 1 x     Awake, alert Attmpets to speak Follows commands BUE/BLE At least antigravity LUE Wound c/d/i  LABS: Lab Results  Component Value Date   CREATININE 0.73 12/07/2013   BUN 34* 12/07/2013   NA 141 12/07/2013   K 4.4 12/07/2013   CL 101 12/07/2013   CO2 28 12/07/2013   Lab Results  Component Value Date   WBC 12.5* 12/07/2013   HGB 12.8* 12/07/2013   HCT 39.2 12/07/2013   MCV 84.5 12/07/2013   PLT 309 12/07/2013    IMPRESSION: - 53 y.o. male POD #10 s/p crani for GBM, appears more awake today  PLAN: - Cont BiPAP - Decrease dex - If he remains relatively alert, may consider repeat SLP eval

## 2013-12-07 NOTE — Progress Notes (Signed)
Patient taken off Bipap and placed on 3L nasal cannula at this time. Tolerating well, RT will continue to monitor.

## 2013-12-08 ENCOUNTER — Inpatient Hospital Stay (HOSPITAL_COMMUNITY): Payer: BC Managed Care – PPO

## 2013-12-08 LAB — BASIC METABOLIC PANEL
BUN: 32 mg/dL — AB (ref 6–23)
CO2: 28 mEq/L (ref 19–32)
Calcium: 8.7 mg/dL (ref 8.4–10.5)
Chloride: 99 mEq/L (ref 96–112)
Creatinine, Ser: 0.73 mg/dL (ref 0.50–1.35)
GLUCOSE: 227 mg/dL — AB (ref 70–99)
Potassium: 4.5 mEq/L (ref 3.7–5.3)
Sodium: 140 mEq/L (ref 137–147)

## 2013-12-08 LAB — CBC WITH DIFFERENTIAL/PLATELET
BASOS ABS: 0 10*3/uL (ref 0.0–0.1)
BASOS PCT: 0 % (ref 0–1)
EOS ABS: 0 10*3/uL (ref 0.0–0.7)
EOS PCT: 0 % (ref 0–5)
HCT: 37.3 % — ABNORMAL LOW (ref 39.0–52.0)
Hemoglobin: 12.6 g/dL — ABNORMAL LOW (ref 13.0–17.0)
Lymphocytes Relative: 7 % — ABNORMAL LOW (ref 12–46)
Lymphs Abs: 1.3 10*3/uL (ref 0.7–4.0)
MCH: 28.6 pg (ref 26.0–34.0)
MCHC: 33.8 g/dL (ref 30.0–36.0)
MCV: 84.6 fL (ref 78.0–100.0)
MONO ABS: 1.1 10*3/uL — AB (ref 0.1–1.0)
Monocytes Relative: 6 % (ref 3–12)
Neutro Abs: 16.1 10*3/uL — ABNORMAL HIGH (ref 1.7–7.7)
Neutrophils Relative %: 87 % — ABNORMAL HIGH (ref 43–77)
Platelets: 258 10*3/uL (ref 150–400)
RBC: 4.41 MIL/uL (ref 4.22–5.81)
RDW: 13.9 % (ref 11.5–15.5)
WBC: 18.5 10*3/uL — ABNORMAL HIGH (ref 4.0–10.5)

## 2013-12-08 LAB — GLUCOSE, CAPILLARY
GLUCOSE-CAPILLARY: 175 mg/dL — AB (ref 70–99)
GLUCOSE-CAPILLARY: 130 mg/dL — AB (ref 70–99)
GLUCOSE-CAPILLARY: 197 mg/dL — AB (ref 70–99)
Glucose-Capillary: 161 mg/dL — ABNORMAL HIGH (ref 70–99)
Glucose-Capillary: 172 mg/dL — ABNORMAL HIGH (ref 70–99)
Glucose-Capillary: 175 mg/dL — ABNORMAL HIGH (ref 70–99)
Glucose-Capillary: 204 mg/dL — ABNORMAL HIGH (ref 70–99)

## 2013-12-08 LAB — PHOSPHORUS: Phosphorus: 3.2 mg/dL (ref 2.3–4.6)

## 2013-12-08 LAB — MAGNESIUM: Magnesium: 2.1 mg/dL (ref 1.5–2.5)

## 2013-12-08 MED ORDER — DEXAMETHASONE SODIUM PHOSPHATE 4 MG/ML IJ SOLN
2.0000 mg | Freq: Two times a day (BID) | INTRAMUSCULAR | Status: AC
  Administered 2013-12-09 – 2013-12-10 (×4): 2 mg via INTRAVENOUS
  Filled 2013-12-08 (×4): qty 0.5

## 2013-12-08 MED ORDER — DEXAMETHASONE 0.5 MG/5ML PO SOLN
1.0000 mg | Freq: Two times a day (BID) | ORAL | Status: AC
  Administered 2013-12-11 – 2013-12-12 (×4): 1 mg
  Filled 2013-12-08 (×4): qty 10

## 2013-12-08 MED ORDER — DEXAMETHASONE 0.5 MG/5ML PO SOLN
0.5000 mg | Freq: Two times a day (BID) | ORAL | Status: DC
  Administered 2013-12-13: 0.5 mg
  Filled 2013-12-08 (×2): qty 5

## 2013-12-08 NOTE — Progress Notes (Signed)
Pt seen and examined. No issues overnight. Pt more awake this am, says name and stated he needed to use the bathroom  EXAM: Temp:  [97.9 F (36.6 C)-100.4 F (38 C)] 99 F (37.2 C) (01/25 0800) Pulse Rate:  [43-81] 81 (01/25 0900) Resp:  [13-34] 18 (01/25 0900) BP: (102-138)/(53-80) 135/75 mmHg (01/25 0900) SpO2:  [90 %-100 %] 99 % (01/25 0900) FiO2 (%):  [40 %] 40 % (01/25 0337) Weight:  [127 kg (279 lb 15.8 oz)] 127 kg (279 lb 15.8 oz) (01/25 0500) Intake/Output     01/24 0701 - 01/25 0700 01/25 0701 - 01/26 0700   I.V. (mL/kg) 420 (3.3) 100 (0.8)   NG/GT 1875 540   IV Piggyback 205    Total Intake(mL/kg) 2500 (19.7) 640 (5)   Urine (mL/kg/hr) 2040 (0.7) 300 (0.9)   Total Output 2040 300   Net +460 +340         Awake, alert Right CNIII palsy stable Follows commands BUE/BLE Wound c/d/i  LABS: Lab Results  Component Value Date   CREATININE 0.73 12/08/2013   BUN 32* 12/08/2013   NA 140 12/08/2013   K 4.5 12/08/2013   CL 99 12/08/2013   CO2 28 12/08/2013   Lab Results  Component Value Date   WBC 18.5* 12/08/2013   HGB 12.6* 12/08/2013   HCT 37.3* 12/08/2013   MCV 84.6 12/08/2013   PLT 258 12/08/2013    IMPRESSION: - 53 y.o. male POD# 11 s/p crani for GBM, neurologically improving  PLAN: - Cont observation - Repeat SLP eval tomorrow

## 2013-12-08 NOTE — Progress Notes (Signed)
Placed on 4L  for 4 hours

## 2013-12-08 NOTE — Progress Notes (Signed)
took pt off BIPAP and placed on 4L [place].

## 2013-12-08 NOTE — Progress Notes (Signed)
Name: Danny Suarez MRN: [id number] DOB: 04-05-1961    ADMISSION DATE:  11/29/2013 CONSULTATION DATE:  11/28/12  REFERRING MD :  Dr. Kathyrn Sheriff PRIMARY SERVICE: NSGY  CHIEF COMPLAINT:  Resp Failure  BRIEF PATIENT DESCRIPTION: 53 y/o M admitted on 1/13 after abnormal MRI/CT of brain with findings of anterior thrid ventricular tumor.  Underwent R craniotomy for resection / biopsy on 1/14.  Returned to ICU on vent.  Minimally responsive post-op.  SIGNIFICANT EVENTS / STUDIES:  1/13 - Admit for planned craniotomy after abnormal MRI / CT 1/14 - Craniotomy with tumor resection / bx per Dr. Kathyrn Sheriff 12/01/13: ery somnolent but wakes up with brisk stimulation.  Bradycardic overnight with normal BP. 12/02/13: a little bit more responsive v same as of 24h ago. Remains extubated. Can protect airway somewhat 12/03/13: Still somnolent but some more awake compared to 24h earlier. PCCM signed off 12/05/13: start cpap due to somnolence and OSA  LINES / TUBES: OETT 1/15>>>1/17  CULTURES / PATHOLOGY: Tumor Path 1/14>>> GLIOBLASTOMA MULTIFORME 4  ANTIBIOTICS: Ancef post OR 1/14>>1/15  SUBJECTIVE:  A little more awake and interactive last 24 h Wore BiPAP   VITAL SIGNS: Temp:  [97.9 F (36.6 C)-100.4 F (38 C)] 99 F (37.2 C) (01/25 0800) Pulse Rate:  [43-80] 77 (01/25 0800) Resp:  [13-34] 15 (01/25 0800) BP: (102-138)/(53-80) 123/77 mmHg (01/25 0800) SpO2:  [90 %-100 %] 99 % (01/25 0800) FiO2 (%):  [40 %] 40 % (01/25 0337) Weight:  [127 kg (279 lb 15.8 oz)] 127 kg (279 lb 15.8 oz) (01/25 0500) Vent Mode:  [-] BIPAP FiO2 (%):  [40 %] 40 % Set Rate:  [10 bmp] 10 bmp PEEP:  [5 cmH20] 5 cmH20 Pressure Support:  [10 cmH20] 10 cmH20 INTAKE / OUTPUT: Intake/Output     01/24 0701 - 01/25 0700 01/25 0701 - 01/26 0700   I.V. (mL/kg) 420 (3.3) 80 (0.6)   NG/GT 1875 455   IV Piggyback 205    Total Intake(mL/kg) 2500 (19.7) 535 (4.2)   Urine (mL/kg/hr) 2040 (0.7) 300 (1.1)   Total Output  2040 300   Net +460 +235         PHYSICAL EXAMINATION: General:  wdwn adult male in NAD. Neuro: RASS -1 to 0 equivalent. Follows commands and a bit more awake. Moves 4s. Seems to understand.  HEENT: Post crani, mm pink/moist, no JVD, swelling of R orbital tissues, reactive pupils Cardiovascular:  s1s2 rrr, no m/r/g, occ brady high 50s Lungs:  resp's even/non-labored, lungs bilaterally clear Abdomen:  Round/soft, bsx4 active, OGT  Musculoskeletal:  L TKR incision site healing with steri strips intact, no s/s of infection Skin:  Warm/dry, +1 generalized edema   LABS: PULMONARY  Recent Labs Lab 12/05/13 1014 12/06/13 1058 12/07/13 1125  PHART 7.429 7.456* 7.459*  PCO2ART 39.0 37.8 41.2  PO2ART 75.0* 153.0* 98.5  HCO3 25.8* 26.3* 28.8*  TCO2 27 27.5 30.1  O2SAT 95.0 99.7 98.4    CBC  Recent Labs Lab 12/02/13 0415 12/07/13 0500 12/08/13 0250  HGB 11.9* 12.8* 12.6*  HCT 35.1* 39.2 37.3*  WBC 9.3 12.5* 18.5*  PLT 315 309 258    COAGULATION No results found for this basename: INR,  in the last 168 hours  CARDIAC  No results found for this basename: TROPONINI,  in the last 168 hours  Recent Labs Lab 12/07/13 0500  PROBNP 14.8     CHEMISTRY  Recent Labs Lab 12/02/13 0415 12/07/13 0500 12/08/13 0250  NA 143 141  140  K 4.3 4.4 4.5  CL 103 101 99  CO2 26 28 28   GLUCOSE 190* 141* 227*  BUN 33* 34* 32*  CREATININE 0.81 0.73 0.73  CALCIUM 9.0 8.7 8.7  MG 2.4 2.3 2.1  PHOS 3.8 3.5 3.2   Estimated Creatinine Clearance: 154.7 ml/min (by C-G formula based on Cr of 0.73).   LIVER No results found for this basename: AST, ALT, ALKPHOS, BILITOT, PROT, ALBUMIN, INR,  in the last 168 hours   INFECTIOUS No results found for this basename: LATICACIDVEN, PROCALCITON,  in the last 168 hours   ENDOCRINE CBG (last 3)   Recent Labs  12/07/13 2358 12/08/13 0332 12/08/13 0750  GLUCAP 161* 204* 172*         IMAGING x48h  Dg Chest Port 1  View  12/08/2013   CLINICAL DATA:  Infiltrates  EXAM: PORTABLE CHEST - 1 VIEW  COMPARISON:  Yesterday  FINDINGS: Central venous catheter stable. Stable feeding tube tip in the fundus of the stomach. Low volumes. Stable left basilar atelectasis. No pneumothorax.  IMPRESSION: Stable left base atelectasis.   Electronically Signed   By: Maryclare Bean M.D.   On: 12/08/2013 07:34   Dg Chest Port 1 View  12/07/2013   CLINICAL DATA:  Respiratory failure.  EXAM: PORTABLE CHEST - 1 VIEW  COMPARISON:  11/24/2013  FINDINGS: Feeding tube is in the body of the stomach with the tip in the fundus. Central line is in good position. Endotracheal tube has been removed.  Heart size and pulmonary vascularity are normal and the lungs are clear.  IMPRESSION: No acute abnormality. Interval removal of the endotracheal tube and replacement of the NG tube with a feeding tube. Feeding tube tip is in the fundus of the stomach.   Electronically Signed   By: Rozetta Nunnery M.D.   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  Not classic cushing triad, concern for increase ICH Hypersomnulence  P:   - Rec' s per NSGY. - Decadron taper per NSGY - Keppra for seizure prophylaxis. - Will attempt swallow evaluation on 1/26  PULMONARY A: Known OSA on CPAP at home Acute Respiratory Failure > resolved  P:   -Continue bipap qhs + datyime 2h on / 4h off -add NTS prn -will need to discuss goals with his wife, ? Would he want to be reintubated if needed  CARDIOVASCULAR A:  Normotension  - off labetalol since 1/18. P:  - ICU monitoring. - Continue IVF until able to take PO.  RENAL A:   No acute issues  P:   - Monitor renal fxn.  GASTROINTESTINAL A:    PPI. Nutirition. P:   - Protonix. -NGT. -TF per nutrition  HEMATOLOGIC A:   Mild Anemia Wafarin at home prior to surgery (DVT proph  post knee replacement?) P:   - Monitor H/H trend. - SCD's for DVT proph. - Medical DVT prophylaxis when OK by NSGY. - PRBC for hgb < 7gm%  INFECTIOUS A:   Post-Op Craniotomy  P:   - Abx per NSGY > now off.  ENDOCRINE A:   At Risk Hyper / Hypoglycemia  P:   -CBG's Q4 while on on continuous feeds.   Global: Tenuous mental and resp status but slightly improved last 48h. Continue ICU monitoring. Goals of care need established    Baltazar Apo, MD, PhD 12/08/2013,  9:12 AM [facility] [phone] or if no answer [phone]

## 2013-12-09 DIAGNOSIS — R7309 Other abnormal glucose: Secondary | ICD-10-CM

## 2013-12-09 DIAGNOSIS — R739 Hyperglycemia, unspecified: Secondary | ICD-10-CM

## 2013-12-09 DIAGNOSIS — C719 Malignant neoplasm of brain, unspecified: Secondary | ICD-10-CM

## 2013-12-09 LAB — BASIC METABOLIC PANEL
BUN: 30 mg/dL — ABNORMAL HIGH (ref 6–23)
CALCIUM: 8.6 mg/dL (ref 8.4–10.5)
CO2: 29 mEq/L (ref 19–32)
Chloride: 102 mEq/L (ref 96–112)
Creatinine, Ser: 0.68 mg/dL (ref 0.50–1.35)
GFR calc Af Amer: >90 mL/min (ref >90)
Glucose, Bld: 199 mg/dL — ABNORMAL HIGH (ref 70–99)
POTASSIUM: 4.5 meq/L (ref 3.7–5.3)
SODIUM: 142 meq/L (ref 137–147)

## 2013-12-09 LAB — MAGNESIUM: Magnesium: 2.2 mg/dL (ref 1.5–2.5)

## 2013-12-09 LAB — GLUCOSE, CAPILLARY
GLUCOSE-CAPILLARY: 199 mg/dL — AB (ref 70–99)
Glucose-Capillary: 166 mg/dL — ABNORMAL HIGH (ref 70–99)
Glucose-Capillary: 147 mg/dL — ABNORMAL HIGH (ref 70–99)
Glucose-Capillary: 206 mg/dL — ABNORMAL HIGH (ref 70–99)
Glucose-Capillary: 171 mg/dL — ABNORMAL HIGH (ref 70–99)

## 2013-12-09 LAB — PHOSPHORUS: PHOSPHORUS: 3 mg/dL (ref 2.3–4.6)

## 2013-12-09 LAB — CBC
HCT: 34.5 % — ABNORMAL LOW (ref 39.0–52.0)
Hemoglobin: 11.4 g/dL — ABNORMAL LOW (ref 13.0–17.0)
MCH: 27.9 pg (ref 26.0–34.0)
MCHC: 33 g/dL (ref 30.0–36.0)
MCV: 84.4 fL (ref 78.0–100.0)
Platelets: 223 10*3/uL (ref 150–400)
RBC: 4.09 MIL/uL — ABNORMAL LOW (ref 4.22–5.81)
RDW: 13.7 % (ref 11.5–15.5)
WBC: 16.5 10*3/uL — AB (ref 4.0–10.5)

## 2013-12-09 MED ORDER — INSULIN GLARGINE 100 UNIT/ML ~~LOC~~ SOLN
10.0000 [IU] | Freq: Every day | SUBCUTANEOUS | Status: DC
  Administered 2013-12-09 – 2013-12-23 (×14): 10 [IU] via SUBCUTANEOUS
  Filled 2013-12-09 (×17): qty 0.1

## 2013-12-09 NOTE — Evaluation (Addendum)
Speech Language Pathology Evaluation Patient Details Name: Danny Suarez MRN: [id number] DOB: Dec 20, 1960 Today's Date: 12/09/2013 Time: 8182-9937 SLP Time Calculation (min): 17 min  Problem List:  Patient Active Problem List   Diagnosis Date Noted  . Hyperglycemia 12/09/2013  . Coma 12/02/2013  . Encephalopathy acute 12/02/2013  . Acute respiratory failure 11/30/2013  . Altered mental status 11/30/2013  . Brain tumor 12/08/2013   Past Medical History:  Past Medical History  Diagnosis Date  . Sleep apnea    Past Surgical History:  Past Surgical History  Procedure Laterality Date  . Left knee replacement  11/2013  . Craniotomy  11/28/2013    Tumor resection   . Craniotomy Right 12/03/2013    Procedure: RIGHT CRANIOTOMY FOR RESECTION OF TUMOR;  Surgeon: Consuella Lose, MD;  Location: [facility];  Service: Neurosurgery;  Laterality: Right;  RIGHT CRANIOTOMY FOR RESECTION OF TUMOR   HPI:  Danny Suarez is a 53 year old man initially seen by neurosurgeon for discussion of an abnormal MRI and CT scan of the brain. He is approximately two-week status post left sided knee replacement, however upon questioning he states that his wife noticed that over the last several weeks he has been acting a little bit peculiar. She says that she noted he was saying things which didn't make sense, some vague memory problems. MRI revealed demonstrates a heterogeneously enhancing anterior inferior third ventricular lesion with possible infiltration into bilateral hypothalamus (glioblastoma) and pt. underwent stereotactic right fronto-temporal craniotomy, resection of tumor 12/07/2013. Pt. 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 ST previously assessed for swallowing, however pt. discharged for consistent somlonence. Pt.'s LOC has improved somewhat, communicating with dysarthria noted and ST ordered  for cognitive-speech assessment.    Assessment / Plan / Recommendation Clinical Impression  Pt.'s LOC is improving.  Areas of impairment include sustained attention, awareness, working and prospective memory, problem solving, orientation, orientation and dysarthria.  SLP educated pt. And demonstrated over articulation and increase vocal intensity for improved speech intelligibility.  He would benefit from continued ST for cognitive facilitation.  MD present, SLP requested order for swallow assessment to be initiated when apporpriate (tomorrow?).  CIR follow up recommended.       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")   Cognition  Overall Cognitive Status: Impaired/Different from baseline Arousal/Alertness: Lethargic (alertness is increasing) Orientation Level: Disoriented to place;Disoriented to time;Disoriented to situation;Oriented to person Attention: Sustained Sustained Attention: Impaired Sustained Attention Impairment: Verbal basic Memory: Impaired Memory Impairment: Retrieval deficit;Decreased recall of new information;Decreased short term memory;Prospective memory Decreased Short Term Memory: Verbal basic Awareness: Impaired Awareness Impairment: Intellectual impairment;Emergent impairment;Anticipatory impairment Problem Solving: Impaired Problem Solving Impairment: Verbal basic Safety/Judgment: Impaired     Comprehension  Auditory Comprehension Overall Auditory Comprehension: Impaired Yes/No Questions: Impaired Basic Biographical Questions: 26-50% accurate Commands: Impaired One Step Basic Commands: 25-49% accurate Interfering Components: Attention;Processing speed;Working memory EffectiveTechniques: Tour manager: Not tested Reading  Comprehension Reading Status:  (TBA)    Expression Expression Primary Mode of Expression: Verbal Verbal Expression Overall Verbal Expression: Impaired Initiation: Impaired Level of Generative/Spontaneous Verbalization: Word Repetition:  (NT) Naming:  (will assess further) Pragmatics: Impairment Impairments: Abnormal affect;Eye contact;Monotone Interfering Components: Attention;Speech intelligibility Non-Verbal Means of Communication: Gestures Written Expression Dominant Hand: Right Written Expression:  (TBA)   Oral / Motor Oral Motor/Sensory Function Overall Oral Motor/Sensory Function:  (will assess futher) Motor Speech Overall Motor Speech: Impaired Respiration: Impaired Level of Impairment: Phrase Phonation: Low vocal intensity Resonance: Within functional limits Articulation: Within functional limitis Intelligibility: Intelligibility reduced Word: 0-24% accurate Phrase: 0-24% accurate Motor Planning: Witnin functional limits Effective Techniques: Increased vocal intensity;Over-articulate;Pause   GO     Orbie Pyo Lela Gell M.Ed [employer] [phone]  12/09/2013

## 2013-12-09 NOTE — Progress Notes (Signed)
No issues overnight. Pt is more awake this am, able to speak in sentences.  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 May repeat swallow eval tomorrow per SLP - Consult PMR for CIR admission - Cont dex taper, cont keppra

## 2013-12-09 NOTE — Evaluation (Signed)
Occupational Therapy Evaluation Patient Details Name: Danny Suarez MRN: [id number] DOB: 1960-12-06 Today's Date: 12/09/2013 Time: 9528-4132 OT Time Calculation (min): 16 min  OT Assessment / Plan / Recommendation History of present illness 53 y/o M admitted on 1/13 after abnormal MRI/CT of brain with findings of anterior thrid ventricular tumor.  Underwent R craniotomy for resection / biopsy on 1/14. Pt is s/p Craniotomy with tumor resection.     Clinical Impression   This 53 yo male admitted and underwent above presents to acute OT with decreased AROM in Bil UEs and [name], decreased alertness, increased time and inconsistent at times following one step commands, decreased vision, decreased cognition all affecting pt's ability to be able to care for himself and increased burden on caregiver. Will benefit from acute OT with follow up on CIR.    OT Assessment  Patient needs continued OT Services    Follow Up Recommendations  CIR       Equipment Recommendations   (TBD next venue)       Frequency  Min 2X/week    Precautions / Restrictions Precautions Precautions: Fall Precaution Comments: Pt with recent Lt TKA  Restrictions Weight Bearing Restrictions: No   Pertinent Vitals/Pain Left knee when I tapped it trying to get him to move his LLE, recent knee surgery    ADL  ADL Comments: At this point pt is total A for all BADLs. Pt initially would not open his eyes (can only open left eye), but he finally did.    OT Diagnosis: Generalized weakness;Cognitive deficits;Disturbance of vision;Acute pain;Paresis  OT Problem List: Decreased strength;Decreased range of motion;Decreased activity tolerance;Impaired balance (sitting and/or standing);Impaired vision/perception;Decreased coordination;Decreased cognition;Decreased knowledge of use of DME or AE;Impaired UE functional use;Decreased safety awareness;Pain OT Treatment Interventions: Self-care/ADL training;Therapeutic  exercise;Neuromuscular education;Cognitive remediation/compensation;Therapeutic activities;Visual/perceptual remediation/compensation;Patient/family education;Balance training   OT Goals(Current goals can be found in the care plan section) Acute Rehab OT Goals Patient Stated Goal: Unable  OT Goal Formulation: Patient unable to participate in goal setting Time For Goal Achievement: 12/23/13 Potential to Achieve Goals: Good  Visit Information  Last OT Received On: 12/09/13 Assistance Needed: +2 History of Present Illness: 53 y/o M admitted on 1/13 after abnormal MRI/CT of brain with findings of anterior thrid ventricular tumor.  Underwent R craniotomy for resection / biopsy on 1/14. Pt is s/p Craniotomy with tumor resection.         Prior [place] expects to be discharged to:: Private residence Living Arrangements: Spouse/significant other Available Help at Discharge: Family;Available PRN/intermittently Type of Home: House Home Access: Stairs to enter [employer] of Steps: 2 Entrance Stairs-Rails: Can reach both;Left;Right Home Layout: One level Home Equipment: None Additional Comments: Spoke with wife via telephone; wife has heart condition and is unable to provide much physical (A). wife is also responsible for care of daughter with Cherlyn Cushing Syndrome Prior Function Level of Independence: Independent Comments: pt was independent and working prior to [employer] TKA which ocurred 12/29; since this time pt has been at [facility]  Communication Communication: No difficulties Dominant Hand: Right         Vision/Perception Vision - History Baseline Vision: Wears glasses all the time (per pt with head nods, unsure it this is correct) Vision - Assessment Eye Alignment: Within Functional Limits (when left eye lid held open by therapist) Vision Assessment: Vision tested Additional Comments: Pt would not follow my pen nor  would he look to any direction when I asked;  however when I asked him to follow me he did follow me left, right, up, and down--however unable to maintain in any direction   Cognition  Cognition Arousal/Alertness: Lethargic Behavior During Therapy:  ("crack" of a smile when church was mentioned) Overall Cognitive Status: Impaired/Different from baseline Area of Impairment: Orientation Orientation Level: Place (was oriented to first name when given choices) Following Commands: Follows one step commands inconsistently;Follows one step commands with increased time Problem Solving: Slow processing;Decreased initiation;Requires verbal cues;Requires tactile cues    Extremity/Trunk Assessment Upper Extremity Assessment Upper Extremity Assessment: RUE deficits/detail;LUE deficits/detail RUE Deficits / Details: Can move all joints 1/5-2/5 throughout RUE Coordination: decreased gross motor;decreased fine motor LUE Deficits / Details: Can move all joints 1/5-2/5 throughout LUE Coordination: decreased fine motor;decreased gross motor              End of Session OT - End of Session Activity Tolerance: Patient limited by fatigue Patient left: in bed    Almon Register [phone] 12/09/2013, 9:36 AM

## 2013-12-09 NOTE — Progress Notes (Signed)
PULMONARY / CRITICAL CARE MEDICINE   Name: Danny Suarez MRN: [id number] DOB: [birth year]    ADMISSION DATE:  12/02/2013 CONSULTATION DATE:  11/28/12  REFERRING MD :  Neurosurgery PRIMARY SERVICE: Neurosurgery   CHIEF COMPLAINT:  Resp failure  BRIEF PATIENT DESCRIPTION: 53 yo admitted s/p R craniotomy for tumor resection / biopsy on 1/14.  Brought to ICU intubated postoperativelly.  SIGNIFICANT EVENTS / STUDIES:  1/13   Admit for planned craniotomy after abnormal MRI / CT 1/14   Craniotomy / tumor resection / bx per Dr. Kathyrn Sheriff  LINES / TUBES: OETT 1/15 >>> 1/17  CULTURES:  ANTIBIOTICS: Ancef 1/14 >>>1/15  SUBJECTIVE: No acute overnight events  VITAL SIGNS: Temp:  [97.7 F (36.5 C)-100.1 F (37.8 C)] 98.7 F (37.1 C) (01/26 0700) Pulse Rate:  [50-81] 66 (01/26 0600) Resp:  [12-19] 13 (01/26 0600) BP: (106-137)/(53-89) 119/70 mmHg (01/26 0600) SpO2:  [94 %-100 %] 100 % (01/26 0600) FiO2 (%):  [40 %] 40 % (01/26 0322) Weight:  [126.4 kg (278 lb 10.6 oz)] 126.4 kg (278 lb 10.6 oz) (01/26 0400) Vent Mode:  [-] BIPAP FiO2 (%):  [40 %] 40 % Set Rate:  [10 bmp] 10 bmp PEEP:  [5 cmH20] 5 cmH20 Pressure Support:  [10 cmH20] 10 cmH20  INTAKE / OUTPUT: Intake/Output     01/25 0701 - 01/26 0700 01/26 0701 - 01/27 0700   I.V. (mL/kg) 520 (4.1)    NG/GT 2300    IV Piggyback 205    Total Intake(mL/kg) 3025 (23.9)    Urine (mL/kg/hr) 2400 (0.8)    Total Output 2400     Net +625           PHYSICAL EXAMINATION: General:  No distress Neuro: Follows some commands, non verbal, moves all extremities HEENT: Post craniotomy changes, incision c/d/i, R ptosis Cardiovascular:  Regular, no murmurs Lungs:  Bilateral diminished air entry, few rhonchi Abdomen:  Soft, bowel sounds present Musculoskeletal:  Trace edema Skin:  No rash  CBC  Recent Labs Lab 12/07/13 0500 12/08/13 0250 12/09/13 0305  WBC 12.5* 18.5* 16.5*  HGB 12.8* 12.6* 11.4*  HCT 39.2 37.3* 34.5*  PLT  309 258 223   Coag's No results found for this basename: APTT, INR,  in the last 168 hours  BMET  Recent Labs Lab 12/07/13 0500 12/08/13 0250 12/09/13 0305  NA 141 140 142  K 4.4 4.5 4.5  CL 101 99 102  CO2 28 28 29   BUN 34* 32* 30*  CREATININE 0.73 0.73 0.68  GLUCOSE 141* 227* 199*   Electrolytes  Recent Labs Lab 12/07/13 0500 12/08/13 0250 12/09/13 0305  CALCIUM 8.7 8.7 8.6  MG 2.3 2.1 2.2  PHOS 3.5 3.2 3.0   Sepsis Markers No results found for this basename: LATICACIDVEN, PROCALCITON, O2SATVEN,  in the last 168 hours ABG  Recent Labs Lab 12/05/13 1014 12/06/13 1058 12/07/13 1125  PHART 7.429 7.456* 7.459*  PCO2ART 39.0 37.8 41.2  PO2ART 75.0* 153.0* 98.5   Liver Enzymes No results found for this basename: AST, ALT, ALKPHOS, BILITOT, ALBUMIN,  in the last 168 hours Cardiac Enzymes  Recent Labs Lab 12/07/13 0500  PROBNP 14.8   Glucose  Recent Labs Lab 12/08/13 0750 12/08/13 1155 12/08/13 1550 12/08/13 1938 12/08/13 2326 12/09/13 0306  GLUCAP 172* 175* 175* 130* 197* 199*   CXR:  None today  ASSESSMENT / PLAN:  NEUROLOGIC A:   Glioblastoma multiforme s/p craniotomy  Acute encephalopathy P:   Neurosurgery following Decardron Keppra SLP  PULMONARY A: OSA on home CPAP P:   Supplemental oxygen for SpO2>92 CPAP q hs At risk for   CARDIOVASCULAR A:  No active issues P:  No intervention required  RENAL A:   No acute issues  P:   Trend BMP  GASTROINTESTINAL A:    Nutrition GI Px P:   TF per Nutritionist Protonix  HEMATOLOGIC A:   Mild anemia Wafarin at home prior to surgery (DVT proph post knee replacement?) DVT Px P:   Trend CBC SCDs  INFECTIOUS A:   No active issues P:   No intervention required  ENDOCRINE A:   Hyperglycemia, steroid induced Suboptimal glycemic control P:   CBG Add Lantus 10  I have personally obtained history, examined patient, evaluated and interpreted laboratory and imaging  results, reviewed medical records, formulated assessment / plan and placed orders.  Doree Fudge, MD Pulmonary and [place] Pager: [phone]  12/09/2013, 7:39 AM

## 2013-12-09 NOTE — Progress Notes (Signed)
This note also relates to the following rows which could not be included: Pulse Rate - Cannot attach notes to unvalidated device data Resp - Cannot attach notes to unvalidated device data SpO2 - Cannot attach notes to unvalidated device data   12/09/13 took patient off per Md orders  2hrs on 4hrs of. Patient's sats 96% on room air.f patient is stable. Rt will continue to monitor.

## 2013-12-09 NOTE — Progress Notes (Signed)
Physical Therapy Treatment Patient Details Name: Danny Suarez MRN: [id number] DOB: 06/19/1961 Today's Date: 12/09/2013 Time: 1020-1050 PT Time Calculation (min): 30 min  PT Assessment / Plan / Recommendation  History of Present Illness 54 y/o M admitted on 1/13 after abnormal MRI/CT of brain with findings of anterior thrid ventricular tumor.  Underwent R craniotomy for resection / biopsy on 1/14. Pt is s/p Craniotomy with tumor resection.     PT Comments   Pt cont to require max v/c's to maintain eye opening however followed commands 100% of time and is more verbal. Pt remains to have difficulty with move L LE> L UE. Pt con't to have poor sitting balance and is unable to achieve standing. Pt con't to be appropriate to address above deficits and achieve maximal functional recovery.  Follow Up Recommendations  CIR     Does the patient have the potential to tolerate intense rehabilitation     Barriers to Discharge        Equipment Recommendations       Recommendations for Other Services    Frequency Min 3X/week   Progress towards PT Goals Progress towards PT goals: Progressing toward goals  Plan Current plan remains appropriate    Precautions / Restrictions Precautions Precautions: Fall Precaution Comments: Pt with recent Lt TKA  Restrictions Weight Bearing Restrictions: No   Pertinent Vitals/Pain Denies pain    Mobility  Bed Mobility Overal bed mobility: +2 for physical assistance;+ 2 for safety/equipment;Needs Assistance Bed Mobility: Supine to Sit;Sit to Supine Supine to sit: HOB elevated Sit to supine: HOB elevated;+2 for physical assistance;+2 for safety/equipment;Total assist Sit to sidelying: +2 for safety/equipment;+2 for physical assistance;Max assist General bed mobility comments: pt required 2+ (A) for positioning on EOB; pt with inconsitent response to one step commands today; was given max multimodal cues for sequencing; multiple exercises to stimulate WB  through bil UEs and [name]; pt very flaccid in UEs today and demo decreased ability to WB or hold himeself in sitting position; continued to be lethargic sitting EOB  Transfers Overall transfer level: Needs assistance Transfers: Sit to/from Stand Sit to Stand: +2 physical assistance;Total assist General transfer comment: pt able to achieve clearance of bottom however unable to achieve full upright position. pt with minimal self effort    Exercises     PT Diagnosis:    PT Problem List:   PT Treatment Interventions:     PT Goals (current goals can now be found in the care plan section) Acute Rehab PT Goals Patient Stated Goal: Unable   Visit Information  Last PT Received On: 12/09/13 Assistance Needed: +2 History of Present Illness: 53 y/o M admitted on 1/13 after abnormal MRI/CT of brain with findings of anterior thrid ventricular tumor.  Underwent R craniotomy for resection / biopsy on 1/14. Pt is s/p Craniotomy with tumor resection.      Subjective Data  Patient Stated Goal: Unable    Cognition  Cognition Arousal/Alertness: Lethargic (eyes closed but responds to name and follows commands) Behavior During Therapy: Flat affect Overall Cognitive Status: Impaired/Different from baseline Area of Impairment: Orientation Orientation Level: Place (was oriented to first name when given choices) Current Attention Level: Sustained Following Commands: Follows one step commands inconsistently;Follows one step commands with increased time Problem Solving: Slow processing;Decreased initiation;Difficulty sequencing;Requires verbal cues;Requires tactile cues    Balance  Balance Overall balance assessment: Needs assistance Sitting-balance support: Feet supported Sitting balance-Leahy Scale: Zero Sitting balance - Comments: pt required max posterior support today to tolerate  sititng EOB; pt inconsistent with eyes opening and arousal; attempted multiple strategies to increase UE WBing for pt to be  able to support himself; pt unable to bear weight through his UEs today; tolerated sitting EOB ~8 min with max support; pt with Rt cervical rotation and left sidebend; RN present during EOB activities; pt unable to achieve cervical extension today with max facilitation through shoulders and cervical retractor muscles  Postural control: Posterior lean  End of Session PT - End of Session Equipment Utilized During Treatment: Gait belt Activity Tolerance: Patient limited by lethargy Patient left: in bed;with call bell/phone within reach Nurse Communication: Mobility status;Precautions   GP     Kingsley Callander 12/09/2013, 1:09 PM  Kittie Plater, PT, DPT Pager #: [phone] Office #: [phone]

## 2013-12-09 NOTE — Consult Note (Signed)
Physical Medicine and Rehabilitation Consult  Reason for Consult: Brain tumor resection.  Referring Physician: Dr. Kathyrn Suarez   HPI: Danny Suarez is a 53 y.o. male with history of OSA, recent TKR, who has had cognitive deficits as well as malaise for few weeks prior to surgery. 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 Kathyrn Suarez.  Post op with somnolence as well as bradycardia. He was started on steroids as well as BIPAP and has has slow improvement in mentation. He requires max cues to open eyes but is able to follow commands. He continues with diffuse weakness and therapy working on activity at EOB. PT, OT, ST, MD recommending CIR.   Review of Systems  Unable to perform ROS: mental acuity    Past Medical History  Diagnosis Date  . Sleep apnea    Past Surgical History  Procedure Laterality Date  . Left knee replacement  11/2013  . Craniotomy  11/24/2013    Tumor resection   . Craniotomy Right 12/10/2013    Procedure: RIGHT CRANIOTOMY FOR RESECTION OF TUMOR;  Surgeon: Danny Lose, MD;  Location: [facility];  Service: Neurosurgery;  Laterality: Right;  RIGHT CRANIOTOMY FOR RESECTION OF TUMOR   History reviewed. No pertinent family history.  Social History:  reports that he has never smoked. He does not have any smokeless tobacco history on file. He reports that he does not drink alcohol. His drug history is not on file.    Allergies  Allergen Reactions  . Mobic [Meloxicam]   . Nuvigil [Armodafinil]   . Prednisolone     Medications Prior to Admission  Medication Sig Dispense Refill  . Biotin 1000 MCG tablet Take 1,000 mcg by mouth daily.      . celecoxib (CELEBREX) 200 MG capsule Take 200 mg by mouth daily as needed (pain).      . CHOLECALCIFEROL PO Take 2 tablets by mouth daily.      Marland Kitchen GLUCOSAMINE-CHONDROITIN-MSM PO Take 1 tablet by mouth 2  (two) times daily.       Marland Kitchen HYDROcodone-acetaminophen (NORCO/VICODIN) 5-325 MG per tablet Take 1 tablet by mouth every 4 (four) hours as needed for moderate pain.      . Omega 3 1200 MG CAPS Take 1,200 mg by mouth daily.      Marland Kitchen warfarin (COUMADIN) 5 MG tablet Take 5 mg by mouth daily.        Home: Home Living Family/patient expects to be discharged to:: Private residence Living Arrangements: Spouse/significant other Available Help at Discharge: Family;Available PRN/intermittently Type of Home: House Home Access: Stairs to enter [employer] of Steps: 2 Entrance Stairs-Rails: Can reach both;Left;Right Home Layout: One level Home Equipment: None Additional Comments: Spoke with wife via telephone; wife has heart condition and is unable to provide much physical (A). 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 Pt initially would not open his eyes (can only open left eye), but he finally did.  Cognition: Cognition Overall Cognitive Status:  Impaired/Different from baseline Arousal/Alertness: Lethargic (alertness is increasing) Orientation Level: Oriented to person;Oriented to place Attention: Sustained Sustained Attention: Impaired Sustained Attention Impairment: Verbal basic Memory: Impaired Memory Impairment: Retrieval deficit;Decreased recall of new information;Decreased short term memory;Prospective memory Decreased Short Term Memory: Verbal basic Awareness: Impaired Awareness Impairment: Intellectual impairment;Emergent impairment;Anticipatory impairment Problem  Solving: Impaired Problem Solving Impairment: Verbal basic Safety/Judgment: Impaired Cognition Arousal/Alertness: Lethargic (eyes closed but responds to name and follows commands) Behavior During Therapy: Flat affect Overall Cognitive Status: Impaired/Different from baseline Area of Impairment: Orientation Orientation Level: Place (was oriented to first name when given choices) Current Attention Level: Sustained Memory: Decreased short-term memory Following Commands: Follows one step commands inconsistently;Follows one step commands with increased time Safety/Judgement: Decreased awareness of deficits Awareness: Emergent Problem Solving: Slow processing;Decreased initiation;Difficulty sequencing;Requires verbal cues;Requires tactile cues General Comments: pt more lethargic today and less aroused; unable to understand all verbalizations today; pt with eyes closed throughout session; responded inconsistently to pain on shoulders and [name]   Blood pressure 121/56, pulse 67, temperature 98.7 F (37.1 C), temperature source Axillary, resp. rate 14, height 6\' 2"  (1.88 m), weight 278 lb 10.6 oz (126.4 kg), SpO2 97.00%. Physical Exam  Constitutional: He appears well-developed and well-nourished.  Obese male, somnolent with sonorous breathing. Difficult to arouse. Panda in place.  Bilateral mittens in place.   HENT:  Head: Normocephalic.  Right crani incision with staples intact. Surrounding edema.  Eyes:  Right ptosis.   Neck: No tracheal deviation present. No thyromegaly present.  Cardiovascular: Normal rate and regular rhythm.   Respiratory: Effort normal and breath sounds normal. No respiratory distress. He has no wheezes.  GI: Soft. Bowel sounds are normal. He exhibits no distension.  Neurological:  Left inattention. Right hemiparesis--withdraws to pain. Was able to whisper "Danny Suarez" for name. Senses pain on the left. No volitional movement of the left side seen. Mitten on right hand. Moves  right arm and leg fairly spontaneously   Skin: Skin is warm and dry.  Psychiatric:  lethargic    Results for orders placed during the hospital encounter of 11/16/2013 (from the past 24 hour(s))  GLUCOSE, CAPILLARY     Status: Abnormal   Collection Time    12/08/13  3:50 PM      Result Value Range   Glucose-Capillary 175 (*) 70 - 99 mg/dL  GLUCOSE, CAPILLARY     Status: Abnormal   Collection Time    12/08/13  7:38 PM      Result Value Range   Glucose-Capillary 130 (*) 70 - 99 mg/dL  GLUCOSE, CAPILLARY     Status: Abnormal   Collection Time    12/08/13 11:26 PM      Result Value Range   Glucose-Capillary 197 (*) 70 - 99 mg/dL  BASIC METABOLIC PANEL     Status: Abnormal   Collection Time    12/09/13  3:05 AM      Result Value Range   Sodium 142  137 - 147 mEq/L   Potassium 4.5  3.7 - 5.3 mEq/L   Chloride 102  96 - 112 mEq/L   CO2 29  19 - 32 mEq/L   Glucose, Bld 199 (*) 70 - 99 mg/dL   BUN 30 (*) 6 - 23 mg/dL   Creatinine, Ser 0.68  0.50 - 1.35 mg/dL   Calcium 8.6  8.4 - 10.5 mg/dL   GFR calc non Af Amer >90  >90 mL/min   GFR calc Af Amer >90  >90 mL/min  MAGNESIUM  Status: None   Collection Time    12/09/13  3:05 AM      Result Value Range   Magnesium 2.2  1.5 - 2.5 mg/dL  PHOSPHORUS     Status: None   Collection Time    12/09/13  3:05 AM      Result Value Range   Phosphorus 3.0  2.3 - 4.6 mg/dL  CBC     Status: Abnormal   Collection Time    12/09/13  3:05 AM      Result Value Range   WBC 16.5 (*) 4.0 - 10.5 K/uL   RBC 4.09 (*) 4.22 - 5.81 MIL/uL   Hemoglobin 11.4 (*) 13.0 - 17.0 g/dL   HCT 34.5 (*) 39.0 - 52.0 %   MCV 84.4  78.0 - 100.0 fL   MCH 27.9  26.0 - 34.0 pg   MCHC 33.0  30.0 - 36.0 g/dL   RDW 13.7  11.5 - 15.5 %   Platelets 223  150 - 400 K/uL  GLUCOSE, CAPILLARY     Status: Abnormal   Collection Time    12/09/13  3:06 AM      Result Value Range   Glucose-Capillary 199 (*) 70 - 99 mg/dL  GLUCOSE, CAPILLARY     Status: Abnormal   Collection  Time    12/09/13  7:22 AM      Result Value Range   Glucose-Capillary 166 (*) 70 - 99 mg/dL  GLUCOSE, CAPILLARY     Status: Abnormal   Collection Time    12/09/13 12:02 PM      Result Value Range   Glucose-Capillary 147 (*) 70 - 99 mg/dL   Dg Chest Port 1 View  12/08/2013   CLINICAL DATA:  Infiltrates  EXAM: PORTABLE CHEST - 1 VIEW  COMPARISON:  Yesterday  FINDINGS: Central venous catheter stable. Stable feeding tube tip in the fundus of the stomach. Low volumes. Stable left basilar atelectasis. No pneumothorax.  IMPRESSION: Stable left base atelectasis.   Electronically Signed   By: Maryclare Bean M.D.   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 Does the need for close, 24 hr/day medical supervision in concert with the patient's rehab needs make it unreasonable for this patient to be served in a less intensive setting? Yes and Potentially 2. Co-Morbidities requiring supervision/potential complications: recent left TKA 3. Due to bladder management, bowel management, safety, skin/wound care, disease management, medication administration, pain management and patient education, does the patient require 24 hr/day rehab nursing? Yes 4. Does the patient require coordinated care of a physician, rehab nurse, PT (1-2 hrs/day, 5 days/week), OT (1-2 hrs/day, 5 days/week) and SLP (1-2 hrs/day, 5 days/week) to address physical and functional deficits in the context of the above medical diagnosis(es)? Yes Addressing deficits in the following areas: balance, endurance, locomotion, strength, transferring, bowel/bladder control, bathing, dressing, feeding, grooming, toileting, cognition, speech, language, swallowing and psychosocial support 5. Can the patient actively participate in an intensive therapy program of at least 3 hrs of therapy per day at least 5 days per week? Potentially 6. The potential for patient to make measurable gains while on  inpatient rehab is good 7. Anticipated functional outcomes upon discharge from inpatient rehab are min to mod assist potentially with PT, min to mod assist potentially with OT, min assist with SLP. 8. Estimated rehab length of stay to reach the above functional goals is: 24-30 days 9. Does the patient have adequate social supports to  accommodate these discharge functional goals? Potentially 10. Anticipated D/C setting: Home 11. Anticipated post D/C treatments: to be determined 12. Overall Rehab/Functional Prognosis: good  RECOMMENDATIONS: This patient's condition is appropriate for continued rehabilitative care in the following setting: likely CIR Patient has agreed to participate in recommended program. not applicable at present Note that insurance prior authorization may be required for reimbursement for recommended care.  Comment: Rehab Admissions Coordinator to follow up.  Thanks,  Meredith Staggers, MD, Mellody Drown     12/09/2013

## 2013-12-10 LAB — GLUCOSE, CAPILLARY
GLUCOSE-CAPILLARY: 159 mg/dL — AB (ref 70–99)
GLUCOSE-CAPILLARY: 176 mg/dL — AB (ref 70–99)
GLUCOSE-CAPILLARY: 185 mg/dL — AB (ref 70–99)
Glucose-Capillary: 118 mg/dL — ABNORMAL HIGH (ref 70–99)
Glucose-Capillary: 139 mg/dL — ABNORMAL HIGH (ref 70–99)
Glucose-Capillary: 132 mg/dL — ABNORMAL HIGH (ref 70–99)

## 2013-12-10 MED ORDER — CHLORHEXIDINE GLUCONATE 0.12 % MT SOLN
15.0000 mL | Freq: Two times a day (BID) | OROMUCOSAL | Status: DC
  Administered 2013-12-10 – 2013-12-24 (×29): 15 mL via OROMUCOSAL
  Filled 2013-12-10 (×32): qty 15

## 2013-12-10 MED ORDER — BIOTENE DRY MOUTH MT LIQD
15.0000 mL | Freq: Two times a day (BID) | OROMUCOSAL | Status: DC
  Administered 2013-12-10 – 2013-12-24 (×28): 15 mL via OROMUCOSAL

## 2013-12-10 NOTE — Progress Notes (Signed)
Placed patient on CPAP via FFM,  10.0 cm H20, FIO2 28%.

## 2013-12-10 NOTE — Progress Notes (Signed)
Removed CPAP due to periods of increased RR throughout the night.  Patient on RA at this time. No distress noted. RN aware.

## 2013-12-10 NOTE — Plan of Care (Signed)
Problem: Consults Goal: Diagnosis - Craniotomy Outcome: Completed/Met Date Met:  12/10/13 Tumor

## 2013-12-10 NOTE — Progress Notes (Addendum)
I have left a message for pt's wife to contact me to begin discussions concerning his rehab venue options. Noted pt's wife unable to provide much physical assistance and cares for a daughter with Downs syndrome per OT notations. Rehab venue will depend on caregiver supports and [facility] approval for venue. Not functionally ready for inpt rehab intensity today. I will follow. [phone] I spoke with pt' s wife by phone. She can not provide any physical assist for pt at home. She was expecting him to return to SNF in [place] that he was admitted from. They will need a PEG for admission, can't admit panda per wife. Pt most appropirate for SNF for prolonged rehab recovery. I will alert SW. [phone]

## 2013-12-10 NOTE — Progress Notes (Addendum)
No issues overnight. 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 Stable for transfer to Stepdown  PLAN: - Transfer to stepdown - Cont PT/OT and SLP eval. May repeat swallow eval today per SLP - Appreciate PMR eval. Will likely be stable for transfer to CIR in a few days. - Cont dex taper, cont keppra

## 2013-12-10 NOTE — Evaluation (Signed)
Clinical/Bedside Swallow Evaluation Patient Details  Name: Danny Suarez MRN: [id number] Date of Birth: 1961-03-24  Today's Date: 12/10/2013 Time: 6160-7371 SLP Time Calculation (min): 17 min  Past Medical History:  Past Medical History  Diagnosis Date  . Sleep apnea    Past Surgical History:  Past Surgical History  Procedure Laterality Date  . Left knee replacement  11/2013  . Craniotomy  11/25/2013    Tumor resection   . Craniotomy Right 11/18/2013    Procedure: RIGHT CRANIOTOMY FOR RESECTION OF TUMOR;  Surgeon: Danny Lose, MD;  Location: [facility];  Service: Neurosurgery;  Laterality: Right;  RIGHT CRANIOTOMY FOR RESECTION OF TUMOR   HPI:  Danny Suarez is a 53 year old man initially seen by neurosurgeon for discussion of an abnormal MRI and CT scan of the brain. He is approximately two-week status post left sided knee replacement, however upon questioning he states that his wife noticed that over the last several weeks he has been acting a little bit peculiar. She says that she noted he was saying things which didn't make sense, some vague memory problems. MRI revealed demonstrates a heterogeneously enhancing anterior inferior third ventricular lesion with possible infiltration into bilateral hypothalamus (glioblastoma) and pt. underwent stereotactic right fronto-temporal craniotomy, resection of tumor 12/08/2013. Pt. 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 ST previously assessed for swallowing, however pt. discharged for consistent somlonence. Pt.'s LOC has improved somewhat, communicating with dysarthria noted and ST ordered for cognitive-speech assessment.    Assessment / Plan / Recommendation Clinical Impression  Re-assessment of swallow function today (SLE eval yesterday) due to minimal overall improvements in LOC, cognitive and communicative attempts.  Pt.  exhibited oral deficits including reduced strength and ROM leading to decreased manipulation and transit delays.  Laryngeal elevation sluggish with decreased ROM and suspect decreased pharyngeal contraction and decreased sensation.  No cough, throat clear or wet vocal quality present, therefore suspect pharyngeal residuals present with risk of aspiration.  He will require objective assessment of swallow function with FEES and is making mild progress toword goals, however he would not likely be able to initiate po's if done today (or possibly tomorrow?).  ST will continue       Aspiration Risk  Moderate    Diet Recommendation NPO   Medication Administration: Via alternative means    Other  Recommendations Oral Care Recommendations: Oral care Q4 per protocol   Follow Up Recommendations  24 hour supervision/assistance (depends on progress)    Frequency and Duration min 2x/week  2 weeks   Pertinent Vitals/Pain WDL         Swallow Study         Oral/Motor/Sensory Function Overall Oral Motor/Sensory Function:  (generalized labial/lingual weakness/decr ROM)   Ice Chips Ice chips: Impaired Presentation: Spoon Oral Phase Impairments: Reduced lingual movement/coordination;Impaired anterior to posterior transit Oral Phase Functional Implications: Prolonged oral transit Pharyngeal Phase Impairments: Decreased hyoid-laryngeal movement;Suspected delayed Swallow   Thin Liquid Thin Liquid: Impaired Presentation: Spoon Oral Phase Impairments: Reduced lingual movement/coordination Oral Phase Functional Implications: Oral holding Pharyngeal  Phase Impairments: Suspected delayed Swallow;Decreased hyoid-laryngeal movement    Nectar Thick Nectar Thick Liquid: Not tested   Honey Thick Honey Thick Liquid: Not tested   Puree Puree: Impaired Presentation: Spoon Oral Phase Impairments: Reduced lingual movement/coordination;Impaired anterior to posterior transit Oral Phase Functional Implications:  Prolonged oral transit Pharyngeal Phase Impairments: Suspected delayed Swallow;Decreased hyoid-laryngeal movement   Solid  GO    Solid: Not tested       Danny Suarez M.Ed [employer] [phone]  12/10/2013

## 2013-12-10 NOTE — Progress Notes (Signed)
PULMONARY / CRITICAL CARE MEDICINE   Name: Danny Suarez MRN: [id number] DOB: 1961-07-29    ADMISSION DATE:  12/13/2013 CONSULTATION DATE:  11/28/12  REFERRING MD :  Neurosurgery PRIMARY SERVICE: Neurosurgery   CHIEF COMPLAINT:  Resp failure  BRIEF PATIENT DESCRIPTION: 53 yo admitted s/p R craniotomy for tumor resection / biopsy on 1/14.  Brought to ICU intubated postoperativelly.  SIGNIFICANT EVENTS / STUDIES:  1/13   Admit for planned craniotomy after abnormal MRI / CT 1/14   Craniotomy / tumor resection / bx per Dr. Kathyrn Sheriff  LINES / TUBES: OETT 1/15 >>> 1/17  CULTURES:  ANTIBIOTICS: Ancef 1/14 >>>1/15  SUBJECTIVE:  Able to tolerate CPAP for portion of the night.  VITAL SIGNS: Temp:  [98.1 F (36.7 C)-99 F (37.2 C)] 98.1 F (36.7 C) (01/27 0353) Pulse Rate:  [61-76] 71 (01/27 0600) Resp:  [13-18] 15 (01/27 0600) BP: (91-136)/(56-80) 124/78 mmHg (01/27 0600) SpO2:  [93 %-100 %] 98 % (01/27 0600) Weight:  [131.2 kg (289 lb 3.9 oz)] 131.2 kg (289 lb 3.9 oz) (01/27 0417)    INTAKE / OUTPUT: Intake/Output     01/26 0701 - 01/27 0700 01/27 0701 - 01/28 0700   I.V. (mL/kg) 460 (3.5)    NG/GT 1955    IV Piggyback 210    Total Intake(mL/kg) 2625 (20)    Urine (mL/kg/hr) 150 (0)    Total Output 150     Net +2475          Urine Occurrence 2 x     PHYSICAL EXAMINATION: General:  Resting comfortable Neuro: Follows some commands, attempts to verbalize, somewhat dysarthric, R hemiparesis HEENT: Post craniotomy changes, incision c/d/i, R ptosis Cardiovascular:  Regular, no murmurs Lungs:  Few scattered rhonchi Abdomen:  Soft, non tender Musculoskeletal:  Trace edema Skin:  No rash  CBC  Recent Labs Lab 12/07/13 0500 12/08/13 0250 12/09/13 0305  WBC 12.5* 18.5* 16.5*  HGB 12.8* 12.6* 11.4*  HCT 39.2 37.3* 34.5*  PLT 309 258 223   Coag's No results found for this basename: APTT, INR,  in the last 168 hours  BMET  Recent Labs Lab 12/07/13 0500  12/08/13 0250 12/09/13 0305  NA 141 140 142  K 4.4 4.5 4.5  CL 101 99 102  CO2 28 28 29   BUN 34* 32* 30*  CREATININE 0.73 0.73 0.68  GLUCOSE 141* 227* 199*   Electrolytes  Recent Labs Lab 12/07/13 0500 12/08/13 0250 12/09/13 0305  CALCIUM 8.7 8.7 8.6  MG 2.3 2.1 2.2  PHOS 3.5 3.2 3.0   Sepsis Markers No results found for this basename: LATICACIDVEN, PROCALCITON, O2SATVEN,  in the last 168 hours ABG  Recent Labs Lab 12/05/13 1014 12/06/13 1058 12/07/13 1125  PHART 7.429 7.456* 7.459*  PCO2ART 39.0 37.8 41.2  PO2ART 75.0* 153.0* 98.5   Liver Enzymes No results found for this basename: AST, ALT, ALKPHOS, BILITOT, ALBUMIN,  in the last 168 hours Cardiac Enzymes  Recent Labs Lab 12/07/13 0500  PROBNP 14.8   Glucose  Recent Labs Lab 12/09/13 0722 12/09/13 1202 12/09/13 1608 12/09/13 1928 12/10/13 0036 12/10/13 0352  GLUCAP 166* 147* 206* 171* 185* 159*   CXR:  None today  ASSESSMENT / PLAN:  NEUROLOGIC A:   Glioblastoma multiforme s/p craniotomy  Acute encephalopathy P:   Neurosurgery following Decadron taper Keppra SLP Rehab placement  PULMONARY A: OSA on home CPAP P:   Supplemental oxygen for SpO2>92 CPAP q hs as tolerated  CARDIOVASCULAR A:  No active issues  P:  No intervention required  RENAL A:   No acute issues  P:   Trend BMP  GASTROINTESTINAL A:    Nutrition GI Px P:   TF per Nutritionist Protonix  HEMATOLOGIC A:   Mild anemia Wafarin at home prior to surgery (DVT Px post knee replacement?) DVT Px P:   Trend CBC SCDs  INFECTIOUS A:   No active issues P:   No intervention required  ENDOCRINE A:   Hyperglycemia, steroid induced Improved glycemic glycemic control P:   CBG Lantus 10  I have personally obtained history, examined patient, evaluated and interpreted laboratory and imaging results, reviewed medical records, formulated assessment / plan and placed orders.  Doree Fudge,  MD Pulmonary and [place] Pager: [phone]  12/10/2013, 7:22 AM

## 2013-12-10 NOTE — Progress Notes (Addendum)
NUTRITION FOLLOW UP  Intervention:    1. Vital High Protein @ 85 m/hr.   2. Recommend starting free water flushes 100 ml every 6 hours if pt remains on tube feeding without any oral intake.  Tube feeding regimen provides 2040 kcal, 178 grams of protein, and 1705 ml of H2O.   NUTRITION DIAGNOSIS:  Inadequate oral intake related to inability to eat as evidenced by NPO status; ongoing.   Goal:  Enteral nutrition to provide 22-25 kcals/kg ideal body weight and 100% of estimated protein needs, based on ASPEN guidelines for permissive underfeeding in critically ill obese individuals; met.   Monitor:  TF initiation and tolerance, weight trend, labs  Assessment:   Pt admitted due to abnormal MRI and CT of his brain 1/13. 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 Pt had craniotomy for resection/biopsy on 1/14. .   Per MRI 1/16 pt with acute strokes (right basal ganglia, right thalamus, and inferior right frontal lobe). 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   Per RN mental status somewhat improved, SLP evaluation pending.   TF at goal rate via nasogastric feeding tube. No residuals.   Height: Ht Readings from Last 1 Encounters:  11/16/2013 6' 2"  (1.88 m)    Weight Status:   Wt Readings from Last 1 Encounters:  12/10/13 289 lb 3.9 oz (131.2 kg)  Admission weight 299 lb (135.6 kg)  Re-estimated needs:  Kcal: 1900-2100 Protein: >/= 172 grams  Fluid: > 2 L/day  Skin: head and knee incisions  Diet Order:     Intake/Output Summary (Last 24 hours) at 12/10/13 1240 Last data filed at 12/10/13 1200  Gross per 24 hour  Intake   2815 ml  Output      0 ml  Net   2815 ml    Last BM: 1/23  Labs:   Recent Labs Lab 12/07/13 0500 12/08/13 0250 12/09/13 0305  NA 141 140 142  K 4.4 4.5 4.5  CL 101 99 102  CO2 28 28 29   BUN 34* 32* 30*  CREATININE 0.73 0.73 0.68  CALCIUM 8.7 8.7 8.6  MG 2.3 2.1 2.2  PHOS 3.5 3.2  3.0  GLUCOSE 141* 227* 199*    CBG (last 3)   Recent Labs  12/10/13 0352 12/10/13 0733 12/10/13 1136  GLUCAP 159* 118* 139*    Scheduled Meds: . antiseptic oral rinse  15 mL Mouth Rinse q12n4p  . chlorhexidine  15 mL Mouth Rinse BID  . cholecalciferol  400 Units Oral Daily  . [START ON 11/25/2013] dexamethasone  0.5 mg Per Tube BID  . [START ON 12/11/2013] dexamethasone  1 mg Per Tube BID  . dexamethasone  2 mg Intravenous Q12H  . docusate  100 mg Oral BID  . insulin aspart  0-20 Units Subcutaneous Q4H  . insulin glargine  10 Units Subcutaneous QHS  . levETIRAcetam  500 mg Intravenous Q12H  . pantoprazole (PROTONIX) IV  40 mg Intravenous Daily    Continuous Infusions: . sodium chloride 20 mL/hr at 12/10/13 1200  . feeding supplement (VITAL HIGH PROTEIN) 1,000 mL (12/10/13 1200)    [place], [place], [place] Pager [phone] After Hours Pager

## 2013-12-11 LAB — GLUCOSE, CAPILLARY
GLUCOSE-CAPILLARY: 136 mg/dL — AB (ref 70–99)
Glucose-Capillary: 123 mg/dL — ABNORMAL HIGH (ref 70–99)
Glucose-Capillary: 163 mg/dL — ABNORMAL HIGH (ref 70–99)
Glucose-Capillary: 142 mg/dL — ABNORMAL HIGH (ref 70–99)
Glucose-Capillary: 126 mg/dL — ABNORMAL HIGH (ref 70–99)

## 2013-12-11 MED ORDER — SODIUM CHLORIDE 0.9 % IJ SOLN
10.0000 mL | Freq: Two times a day (BID) | INTRAMUSCULAR | Status: DC
  Administered 2013-12-11: 10 mL
  Administered 2013-12-12: 20 mL
  Administered 2013-12-12 – 2013-12-21 (×15): 10 mL
  Administered 2013-12-21: 20 mL
  Administered 2013-12-22: 10 mL
  Administered 2013-12-22: 20 mL
  Administered 2013-12-23 – 2013-12-24 (×3): 10 mL

## 2013-12-11 MED ORDER — SODIUM CHLORIDE 0.9 % IJ SOLN: 10.0000 mL | INTRAMUSCULAR | Status: DC | PRN

## 2013-12-11 NOTE — Progress Notes (Signed)
Placed pt. On CPAP auto titrate per MD order Min: 5, Max: 16 via FFM. Pt. Is tolerating CPAP well at this time without any complications.

## 2013-12-11 NOTE — Progress Notes (Signed)
No issues overnight. Pt remains unchanged, will speak when aroused  EXAM:  BP 130/82  Pulse 72  Temp(Src) 99.7 F (37.6 C) (Axillary)  Resp 18  Ht 6\' 2"  (1.88 m)  Wt 128.5 kg (283 lb 4.7 oz)  BMI 36.36 kg/m2  SpO2 97%  Easily arousable Speech is fluent Improved right eye opening, continued ophthalmoplegia Good strength Wound c/d/i  IMPRESSION:  53 y.o. male POD# 72 s/p crani for GBM debulking, neurologically stable  PLAN: - Cont current care - Will consult surgery for PEG - Likely placement in SNF once PEG placed  I discussed Danny Suarez's case at the multidisciplinary brain tumor conference today. I Will refer him for medical and radiation oncology evaluation in 2 weeks. Given his current condition and slow recovery, I do not believe he would benefit from repeat resection via a different approach.

## 2013-12-11 NOTE — Progress Notes (Signed)
Physical Therapy Treatment Patient Details Name: Danny Suarez MRN: [id number] DOB: 01-11-1961 Today's Date: 12/11/2013 Time: 1350-1430 PT Time Calculation (min): 40 min  PT Assessment / Plan / Recommendation  History of Present Illness 53 y/o M admitted on 1/13 after abnormal MRI/CT of brain with findings of anterior thrid ventricular tumor.  Underwent R craniotomy for resection / biopsy on 1/14. Pt is s/p Craniotomy with tumor resection.     PT Comments   Pt with increased lethargy this date but able to verbally communicate. Pt con't to have extremely poor sitting balance and L sided weakness greater than R. Assisted RN staff with transferring pt OOB to chair with use of maxi sky lift. Pt tolerated well. Suspect pt recovery to require increased time and would recommend pt to return to [facility] for con't rehab.   Follow Up Recommendations  SNF     Does the patient have the potential to tolerate intense rehabilitation     Barriers to Discharge        Equipment Recommendations       Recommendations for Other Services    Frequency Min 3X/week   Progress towards PT Goals Progress towards PT goals: Progressing toward goals  Plan Discharge plan needs to be updated    Precautions / Restrictions Precautions Precautions: Fall Precaution Comments: Pt with recent Lt TKA  Restrictions Weight Bearing Restrictions: No   Pertinent Vitals/Pain Denies pain    Mobility  Bed Mobility Overal bed mobility: +2 for physical assistance;+ 2 for safety/equipment;Needs Assistance Bed Mobility: Supine to Sit;Sit to Supine Supine to sit: HOB elevated Sit to supine: HOB elevated;+2 for physical assistance;+2 for safety/equipment;Total assist Sit to sidelying: +2 for safety/equipment;+2 for physical assistance;Max assist General bed mobility comments: pt required 2+ (A) for positioning on EOB; pt with inconsitent response to one step commands today; was given max multimodal cues for  sequencing; multiple exercises to stimulate WB through bil UEs and [name]; pt very flaccid in UEs today and demo decreased ability to WB or hold himeself in sitting position; continued to be lethargic sitting EOB  Transfers Overall transfer level: Needs assistance General transfer comment: assisted RN staff and educated RN staff on how to use maxi sky to transfer pt OOB to chair. Pt tolerated well for 1st time OOB during this hospital stay.    Exercises     PT Diagnosis:    PT Problem List:   PT Treatment Interventions:     PT Goals (current goals can now be found in the care plan section)    Visit Information  Last PT Received On: 12/11/13 Assistance Needed: +2 History of Present Illness: 53 y/o M admitted on 1/13 after abnormal MRI/CT of brain with findings of anterior thrid ventricular tumor.  Underwent R craniotomy for resection / biopsy on 1/14. Pt is s/p Craniotomy with tumor resection.      Subjective Data      Cognition  Cognition Arousal/Alertness: Lethargic Behavior During Therapy: Flat affect Overall Cognitive Status: Impaired/Different from baseline Area of Impairment: Problem solving;Safety/judgement;Orientation Orientation Level: Place Current Attention Level: Sustained Memory: Decreased short-term memory Following Commands: Follows one step commands with increased time Safety/Judgement: Decreased awareness of deficits Awareness: Emergent Problem Solving: Slow processing;Decreased initiation;Difficulty sequencing;Requires verbal cues;Requires tactile cues    Balance  Balance Overall balance assessment: Needs assistance Sitting-balance support: Feet supported;Bilateral upper extremity supported (assist to maintain L UE in supportive position) Sitting balance-Leahy Scale: Poor Sitting balance - Comments: pt requiring maxA to maintain upright posture. pt  unable to maintain cervical extension. worked on LE ther ex however pt requiring maximal v/c's to participate and  con't task. pt sat EOB x 15 min. worked on oral care as well. pt initiated with R UE however required modA to hold R UE up and use functionally Postural control: Posterior lean General Comments General comments (skin integrity, edema, etc.): pt incontinent of urine. changed bed pad and donned depends for transfer OOB to chair via max sky to maintain urine if pt becomes incontinent  End of Session PT - End of Session Activity Tolerance: Patient limited by lethargy Patient left: in chair;with call bell/phone within reach;with family/visitor present Nurse Communication: Mobility status;Precautions   GP     Kingsley Callander 12/11/2013, 4:19 PM  Kittie Plater, PT, DPT Pager #: [phone] Office #: [phone]

## 2013-12-11 NOTE — Progress Notes (Signed)
UR completed.  Pt to return to SNF. Notified 3S unit CM of d/c plans for patient. Spoke with Burman Nieves, RN, about pt's need for PEG before transfer.   Sandi Mariscal, RN BSN [place] CCM Trauma/Neuro ICU Case Manager [phone]

## 2013-12-11 NOTE — Clinical Social Work Psychosocial (Signed)
Clinical Social Work Department BRIEF PSYCHOSOCIAL ASSESSMENT 12/11/2013  Patient:  Danny Suarez, Danny Suarez     Account Number:  [id number]     Admit date:  12/02/2013  Clinical Social Worker:  Lovey Newcomer  Date/Time:  12/11/2013 12:27 PM  Referred by:  Physician  Date Referred:  12/11/2013 Referred for  SNF Placement   Other Referral:   Interview type:  Family Other interview type:   Patient not alert and oriented at time of assessment. CSW spoke with wife.    PSYCHOSOCIAL DATA Living Status:  WIFE Admitted from facility:   Level of care:   Primary support name:  Jan Febres Primary support relationship to patient:  SPOUSE Degree of support available:   Support is adequate.    CURRENT CONCERNS Current Concerns  Post-Acute Placement   Other Concerns:    SOCIAL WORK ASSESSMENT / PLAN CSW spoke with patient's wife over phone as no family was at bed side. Wife states that patient is admitted from [facility] and she plans for him to return as she cannot take care of him at home by herself. Wife states that she is happy with the care he receives at this facility. CSW will assist with patient's DC back to facility.   Assessment/plan status:  Psychosocial Support/Ongoing Assessment of Needs Other assessment/ plan:   Complete FL2, Fax, PASRR   Information/referral to community resources:   CSW contact information given to wife.    PATIENT'S/FAMILY'S RESPONSE TO PLAN OF CARE: Patient's wife plans for patient to return to [facility] upon DC. Patient's wife was pleasant, appropriate, and appreciative of CSW contact. CSW will assist with DC.       [place]iz [place], [place], [place], [phone]

## 2013-12-11 NOTE — Progress Notes (Signed)
PULMONARY / CRITICAL CARE MEDICINE  Name: Danny Suarez MRN: [id number] DOB: [birth year]    ADMISSION DATE:  November 27, 2013 CONSULTATION DATE:  11/28/12  REFERRING MD :  Neurosurgery PRIMARY SERVICE: Neurosurgery   CHIEF COMPLAINT:  Resp failure  BRIEF PATIENT DESCRIPTION: 53 yo admitted s/p R craniotomy for tumor resection / biopsy on 1/14.  Brought to ICU intubated postoperativelly.  SIGNIFICANT EVENTS / STUDIES:  1/13   Admit for planned craniotomy after abnormal MRI / CT 1/14   Craniotomy / tumor resection / bx per Dr. Kathyrn Sheriff  LINES / TUBES: OETT 1/15 >>> 1/17  CULTURES:  ANTIBIOTICS: Ancef 1/14 >>>1/15  SUBJECTIVE:  No active issues overnight.  VITAL SIGNS: Temp:  [98.4 F (36.9 C)-99.1 F (37.3 C)] 98.4 F (36.9 C) (01/28 0736) Pulse Rate:  [65-71] 70 (01/28 0800) Resp:  [15-20] 17 (01/28 0800) BP: (112-141)/(65-120) 121/81 mmHg (01/28 0800) SpO2:  [97 %-100 %] 99 % (01/28 0800) Weight:  [128.5 kg (283 lb 4.7 oz)] 128.5 kg (283 lb 4.7 oz) (01/28 0500)    INTAKE / OUTPUT: Intake/Output     01/27 0701 - 01/28 0700 01/28 0701 - 01/29 0700   P.O. 60    I.V. (mL/kg) 440 (3.4)    NG/GT 1985    IV Piggyback 205    Total Intake(mL/kg) 2690 (20.9)    Urine (mL/kg/hr)     Total Output       Net +2690          Urine Occurrence 5 x     PHYSICAL EXAMINATION: General:  No distress Neuro: Follows some commands, verbalizing, improved R hemiparesis HEENT: Post craniotomy changes, incision c/d/i Cardiovascular:  Regular, no murmurs Lungs:  CTAB Abdomen:  Soft, non tender Musculoskeletal:  Trace edema Skin:  No rash  CBC  Recent Labs Lab 12/07/13 0500 12/08/13 0250 12/09/13 0305  WBC 12.5* 18.5* 16.5*  HGB 12.8* 12.6* 11.4*  HCT 39.2 37.3* 34.5*  PLT 309 258 223   Coag's No results found for this basename: APTT, INR,  in the last 168 hours  BMET  Recent Labs Lab 12/07/13 0500 12/08/13 0250 12/09/13 0305  NA 141 140 142  K 4.4 4.5 4.5  CL 101  99 102  CO2 28 28 29   BUN 34* 32* 30*  CREATININE 0.73 0.73 0.68  GLUCOSE 141* 227* 199*   Electrolytes  Recent Labs Lab 12/07/13 0500 12/08/13 0250 12/09/13 0305  CALCIUM 8.7 8.7 8.6  MG 2.3 2.1 2.2  PHOS 3.5 3.2 3.0   Sepsis Markers No results found for this basename: LATICACIDVEN, PROCALCITON, O2SATVEN,  in the last 168 hours ABG  Recent Labs Lab 12/05/13 1014 12/06/13 1058 12/07/13 1125  PHART 7.429 7.456* 7.459*  PCO2ART 39.0 37.8 41.2  PO2ART 75.0* 153.0* 98.5   Liver Enzymes No results found for this basename: AST, ALT, ALKPHOS, BILITOT, ALBUMIN,  in the last 168 hours Cardiac Enzymes  Recent Labs Lab 12/07/13 0500  PROBNP 14.8   Glucose  Recent Labs Lab 12/10/13 1136 12/10/13 1617 12/10/13 2016 12/11/13 0025 12/11/13 0355 12/11/13 0731  GLUCAP 139* 176* 132* 123* 163* 142*   CXR:  None today  ASSESSMENT / PLAN:  NEUROLOGIC A:   Glioblastoma multiforme s/p craniotomy  Acute encephalopathy P:   Neurosurgery following Decadron taper Keppra PT/OT/SLP CIR PULMONARY A: OSA on home CPAP P:   Supplemental oxygen for SpO2>92 CPAP q hs as tolerated  CARDIOVASCULAR A:  No active issues P:  No intervention required  RENAL A:  No acute issues  P:   Trend BMP  GASTROINTESTINAL A:    Nutrition GI Px P:   TF per Nutritionist Protonix  HEMATOLOGIC A:   Mild anemia Wafarin at home prior to surgery (DVT Px post knee replacement?) DVT Px P:   Trend CBC SCDs  INFECTIOUS A:   No active issues P:   No intervention required  ENDOCRINE A:   Hyperglycemia, steroid induced Improved glycemic glycemic control P:   CBG Lantus 10  SDU staus.  PCCM will sign off. Please reconsult if necessary.  I have personally obtained history, examined patient, evaluated and interpreted laboratory and imaging results, reviewed medical records, formulated assessment / plan and placed orders.  Doree Fudge, MD Pulmonary and  [place] Pager: [phone]  12/11/2013, 9:16 AM

## 2013-12-12 DIAGNOSIS — R1310 Dysphagia, unspecified: Secondary | ICD-10-CM

## 2013-12-12 LAB — GLUCOSE, CAPILLARY
GLUCOSE-CAPILLARY: 126 mg/dL — AB (ref 70–99)
Glucose-Capillary: 131 mg/dL — ABNORMAL HIGH (ref 70–99)
Glucose-Capillary: 147 mg/dL — ABNORMAL HIGH (ref 70–99)
Glucose-Capillary: 155 mg/dL — ABNORMAL HIGH (ref 70–99)
Glucose-Capillary: 148 mg/dL — ABNORMAL HIGH (ref 70–99)
Glucose-Capillary: 161 mg/dL — ABNORMAL HIGH (ref 70–99)

## 2013-12-12 MED ORDER — DEXTROSE 5 % IV SOLN
1.0000 g | Freq: Once | INTRAVENOUS | Status: AC
  Administered 2013-12-13: 1 g via INTRAVENOUS
  Filled 2013-12-12: qty 1

## 2013-12-12 NOTE — Consult Note (Signed)
Reason for Consult:Need for enteral access, dysphagia status post removal of brain tumor Referring Physician: Dr. Rachell Cipro Danny Suarez is an 53 y.o. male.  HPI: Patient is status post recent left total knee arthroplasty. In the postoperative period, he developed mental status changes. Workup demonstrated a brain tumor. He underwent craniotomy and resection of a GBM by Dr. Kathyrn Sheriff. Postoperatively, he is unable to swallow. He is receiving tube feeds via nasogastric panda. I was asked to see him in consultation regarding placement of a PEG tube.  Past Medical History  Diagnosis Date  . Sleep apnea     Past Surgical History  Procedure Laterality Date  . Left knee replacement  11/2013  . Craniotomy  12/07/13    Tumor resection   . Craniotomy Right 12/07/2013    Procedure: RIGHT CRANIOTOMY FOR RESECTION OF TUMOR;  Surgeon: Consuella Lose, MD;  Location: [facility];  Service: Neurosurgery;  Laterality: Right;  RIGHT CRANIOTOMY FOR RESECTION OF TUMOR    History reviewed. No pertinent family history.  Social History:  reports that he has never smoked. He does not have any smokeless tobacco history on file. He reports that he does not drink alcohol. His drug history is not on file.  Allergies:  Allergies  Allergen Reactions  . Mobic [Meloxicam]   . Nuvigil [Armodafinil]   . Prednisolone     Medications:  Prior to Admission:  Prescriptions prior to admission  Medication Sig Dispense Refill  . Biotin 1000 MCG tablet Take 1,000 mcg by mouth daily.      . celecoxib (CELEBREX) 200 MG capsule Take 200 mg by mouth daily as needed (pain).      . CHOLECALCIFEROL PO Take 2 tablets by mouth daily.      Marland Kitchen GLUCOSAMINE-CHONDROITIN-MSM PO Take 1 tablet by mouth 2 (two) times daily.       Marland Kitchen HYDROcodone-acetaminophen (NORCO/VICODIN) 5-325 MG per tablet Take 1 tablet by mouth every 4 (four) hours as needed for moderate pain.      . Omega 3 1200 MG CAPS Take 1,200 mg by mouth  daily.      Marland Kitchen warfarin (COUMADIN) 5 MG tablet Take 5 mg by mouth daily.       Scheduled: . antiseptic oral rinse  15 mL Mouth Rinse q12n4p  . chlorhexidine  15 mL Mouth Rinse BID  . cholecalciferol  400 Units Oral Daily  . [START ON 11/19/2013] dexamethasone  0.5 mg Per Tube BID  . dexamethasone  1 mg Per Tube BID  . docusate  100 mg Oral BID  . insulin aspart  0-20 Units Subcutaneous Q4H  . insulin glargine  10 Units Subcutaneous QHS  . levETIRAcetam  500 mg Intravenous Q12H  . sodium chloride  10-40 mL Intracatheter Q12H   Continuous: . sodium chloride 20 mL/hr at 12/12/13 0058  . feeding supplement (VITAL HIGH PROTEIN) 1,000 mL (12/12/13 0600)    Results for orders placed during the hospital encounter of 12/08/2013 (from the past 48 hour(s))  GLUCOSE, CAPILLARY     Status: Abnormal   Collection Time    12/10/13  4:17 PM      Result Value Range   Glucose-Capillary 176 (*) 70 - 99 mg/dL   Comment 1 Notify RN     Comment 2 Documented in Chart    GLUCOSE, CAPILLARY     Status: Abnormal   Collection Time    12/10/13  8:16 PM      Result Value Range   Glucose-Capillary 132 (*)  70 - 99 mg/dL   Comment 1 Notify RN     Comment 2 Documented in Chart    GLUCOSE, CAPILLARY     Status: Abnormal   Collection Time    12/11/13 12:25 AM      Result Value Range   Glucose-Capillary 123 (*) 70 - 99 mg/dL   Comment 1 Documented in Chart     Comment 2 Notify RN    GLUCOSE, CAPILLARY     Status: Abnormal   Collection Time    12/11/13  3:55 AM      Result Value Range   Glucose-Capillary 163 (*) 70 - 99 mg/dL   Comment 1 Documented in Chart     Comment 2 Notify RN    GLUCOSE, CAPILLARY     Status: Abnormal   Collection Time    12/11/13  7:31 AM      Result Value Range   Glucose-Capillary 142 (*) 70 - 99 mg/dL  GLUCOSE, CAPILLARY     Status: Abnormal   Collection Time    12/11/13 11:15 AM      Result Value Range   Glucose-Capillary 126 (*) 70 - 99 mg/dL   Comment 1 Notify RN      Comment 2 Documented in Chart    GLUCOSE, CAPILLARY     Status: Abnormal   Collection Time    12/11/13  3:37 PM      Result Value Range   Glucose-Capillary 136 (*) 70 - 99 mg/dL   Comment 1 Notify RN     Comment 2 Documented in Chart    GLUCOSE, CAPILLARY     Status: Abnormal   Collection Time    12/11/13  7:42 PM      Result Value Range   Glucose-Capillary 126 (*) 70 - 99 mg/dL   Comment 1 Notify RN     Comment 2 Documented in Chart    GLUCOSE, CAPILLARY     Status: Abnormal   Collection Time    12/11/13 11:30 PM      Result Value Range   Glucose-Capillary 131 (*) 70 - 99 mg/dL   Comment 1 Notify RN     Comment 2 Documented in Chart    GLUCOSE, CAPILLARY     Status: Abnormal   Collection Time    12/12/13  3:41 AM      Result Value Range   Glucose-Capillary 155 (*) 70 - 99 mg/dL   Comment 1 Notify RN     Comment 2 Documented in Chart    GLUCOSE, CAPILLARY     Status: Abnormal   Collection Time    12/12/13  7:28 AM      Result Value Range   Glucose-Capillary 147 (*) 70 - 99 mg/dL  GLUCOSE, CAPILLARY     Status: Abnormal   Collection Time    12/12/13 12:23 PM      Result Value Range   Glucose-Capillary 148 (*) 70 - 99 mg/dL    No results found.  Review of Systems  Unable to perform ROS: mental status change   Blood pressure 157/90, pulse 91, temperature 99.8 F (37.7 C), temperature source Oral, resp. rate 25, height 6\' 2"  (1.88 m), weight 284 lb 13.4 oz (129.2 kg), SpO2 95.00%. Physical Exam  Constitutional: He appears well-developed and well-nourished. No distress.  HENT:  Right craniotomy incision, nasal panda  Neck: Neck supple. No tracheal deviation present.  Cardiovascular: Normal rate, regular rhythm, normal heart sounds and intact distal pulses.   Respiratory:  Effort normal and breath sounds normal. No stridor. No respiratory distress. He has no wheezes. He has no rales.  GI: Soft. He exhibits no distension. There is no tenderness. There is no rebound and  no guarding.  No upper abdominal scars  Musculoskeletal: He exhibits no edema.  Neurological:  Eyes closed, minimally responsive, some spontaneous movements with right upper extremity, Does not follow commands, occasional grunting in response to question  Skin: Skin is warm.    Assessment/Plan: Dysphagia status post craniotomy for resection of GBM. We'll plan PEG tube placement tomorrow by Dr. Doreen Salvage. I have discussed this case in detail with him. We will perform this in the endoscopy suite. I will contact his wife for consent. Tube feeds held at midnight. Perioperative antibiotics ordered.  Danny Suarez E 12/12/2013, 12:43 PM

## 2013-12-12 NOTE — Progress Notes (Signed)
Occupational Therapy Treatment Patient Details Name: Danny Suarez MRN: [id number] DOB: Mar 15, 1961 Today's Date: 12/12/2013 Time: 7628-3151 OT Time Calculation (min): 39 min  OT Assessment / Plan / Recommendation  History of present illness 53 y/o M admitted on 1/13 after abnormal MRI/CT of brain with findings of anterior thrid ventricular tumor.  Underwent R craniotomy for resection / biopsy on 1/14. Pt is s/p Craniotomy with tumor resection.     OT comments  Pt more lethargic this session. Pt able to verbalize he is in [place], that his name is Danny Suarez and that his wife's name is Marcie Bal. This is after sitting pt EOB and using sternal rub to increase alertness. R gaze preference. Apparent ptosis L eye. Flaccid LUE. Using RUE functionally. Given level of deficits, pt will need SNF for rehab. Will continue to follow.  Follow Up Recommendations  SNF;Supervision/Assistance - 24 hour    Barriers to Discharge       Equipment Recommendations  None recommended by OT    Recommendations for Other Services    Frequency Min 2X/week   Progress towards OT Goals Progress towards OT goals: Progressing toward goals  Plan Discharge plan needs to be updated    Precautions / Restrictions Precautions Precautions: Fall Precaution Comments: Pt with recent Lt TKA ; crani Restrictions Weight Bearing Restrictions: No   Pertinent Vitals/Pain stable    ADL  Eating/Feeding: NPO ADL Comments: total a for all ADL; washed face with hand over hand; alb eot point to comb when asked     OT Diagnosis:    OT Problem List:   OT Treatment Interventions:     OT Goals(current goals can now be found in the care plan section) Acute Rehab OT Goals Patient Stated Goal: Unable  OT Goal Formulation: Patient unable to participate in goal setting Time For Goal Achievement: 12/23/13 Potential to Achieve Goals: Good ADL Goals Additional ADL Goal #1: Pt will follow one step commands with no greater than a 10 second  delay Additional ADL Goal #2: Pt will be able to participate in 10 minutes of Bil UE exercises (AAROM) Additional ADL Goal #3: Pt will be able to sit EOB with no greater than Mod A to work on sitting balance for 10 minutes to work towards [employer]  Visit Information  Last OT Received On: 12/12/13 Assistance Needed: +2 History of Present Illness: 53 y/o M admitted on 1/13 after abnormal MRI/CT of brain with findings of anterior thrid ventricular tumor.  Underwent R craniotomy for resection / biopsy on 1/14. Pt is s/p Craniotomy with tumor resection.      Subjective Data      Prior Functioning       Cognition  Cognition Arousal/Alertness: Lethargic Behavior During Therapy: Flat affect Overall Cognitive Status: Impaired/Different from baseline Area of Impairment: Orientation;Attention;Following commands;Awareness;Problem solving General Comments: More lethargic today. Brief periods of being awake and following commands with significant delay R side of face appears edematous    Mobility  Bed Mobility Overal bed mobility: Needs Assistance;+2 for physical assistance Bed Mobility: Supine to Sit;Sit to Supine Supine to sit: HOB elevated Sit to supine: Total assist;+2 for physical assistance;HOB elevated Sit to sidelying: Total assist;+2 for physical assistance;HOB elevated    Exercises      Balance Balance Overall balance assessment: Needs assistance Sitting-balance support: Single extremity supported;Feet supported Sitting balance-Leahy Scale: Zero Postural control: Posterior lean;Left lateral lean  End of Session OT - End of Session Activity Tolerance: Patient limited by lethargy Patient left: in bed;with call  bell/phone within reach Nurse Communication: Mobility status;Other (comment) (status)  GO     Chaslyn Eisen,HILLARY 12/12/2013, 3:51 PM [facility], OTR/L  [phone] 12/12/2013

## 2013-12-12 NOTE — Progress Notes (Signed)
Patient ID: Danny Suarez, male   DOB: 03-05-1961, 53 y.o.   MRN: [id number] I spoke with his wife on the phone and discussed PEG tube placement including the risks and benefits. She agrees and phone consent was obtained. I will notify Dr. Hulen Skains. Georganna Skeans, MD, MPH, FACS Pager: [phone]

## 2013-12-12 NOTE — Progress Notes (Signed)
No issues overnight. Pt remains unchanged, will speak when aroused  EXAM:  BP 131/86  Pulse 86  Temp(Src) 99.7 F (37.6 C) (Axillary)  Resp 21  Ht 6\' 2"  (1.88 m)  Wt 129.2 kg (284 lb 13.4 oz)  BMI 36.56 kg/m2  SpO2 94%  Easily arousable Slight right eye opening, continued ophthalmoplegia Good strength Wound c/d/i  IMPRESSION:  53 y.o. male POD# 15 s/p crani for GBM debulking, neurologically stable  PLAN: - Cont current care - Spoke with surgery today, plan for PEG tomorrow - Likely placement in SNF once PEG placed  I spoke with the patient's wife today and updated her regarding his status and need for PEG.

## 2013-12-13 ENCOUNTER — Encounter (HOSPITAL_COMMUNITY): Admission: RE | Disposition: E | Payer: Self-pay | Source: Ambulatory Visit | Attending: Neurosurgery

## 2013-12-13 HISTORY — PX: ESOPHAGOGASTRODUODENOSCOPY: SHX5428

## 2013-12-13 HISTORY — PX: PEG PLACEMENT: SHX5437

## 2013-12-13 LAB — GLUCOSE, CAPILLARY
GLUCOSE-CAPILLARY: 126 mg/dL — AB (ref 70–99)
GLUCOSE-CAPILLARY: 141 mg/dL — AB (ref 70–99)
GLUCOSE-CAPILLARY: 120 mg/dL — AB (ref 70–99)
Glucose-Capillary: 156 mg/dL — ABNORMAL HIGH (ref 70–99)
Glucose-Capillary: 117 mg/dL — ABNORMAL HIGH (ref 70–99)
Glucose-Capillary: 139 mg/dL — ABNORMAL HIGH (ref 70–99)
Glucose-Capillary: 118 mg/dL — ABNORMAL HIGH (ref 70–99)

## 2013-12-13 SURGERY — EGD (ESOPHAGOGASTRODUODENOSCOPY)
Anesthesia: Moderate Sedation

## 2013-12-13 MED ORDER — DEXAMETHASONE SODIUM PHOSPHATE 4 MG/ML IJ SOLN
0.5000 mg | Freq: Two times a day (BID) | INTRAMUSCULAR | Status: AC
  Administered 2013-12-13 – 2013-12-14 (×3): 0.52 mg via INTRAVENOUS
  Filled 2013-12-13 (×3): qty 0.13

## 2013-12-13 MED ORDER — FENTANYL CITRATE 0.05 MG/ML IJ SOLN
INTRAMUSCULAR | Status: AC
  Administered 2013-12-13: 100 ug
  Filled 2013-12-13: qty 2

## 2013-12-13 MED ORDER — CEFAZOLIN SODIUM 1-5 GM-% IV SOLN
1.0000 g | Freq: Once | INTRAVENOUS | Status: AC
  Administered 2013-12-13: 1 g via INTRAVENOUS
  Filled 2013-12-13: qty 50

## 2013-12-13 MED ORDER — MIDAZOLAM HCL 2 MG/2ML IJ SOLN
INTRAMUSCULAR | Status: AC
  Administered 2013-12-13: 4 mg
  Filled 2013-12-13: qty 6

## 2013-12-13 MED ORDER — FENTANYL CITRATE 0.05 MG/ML IJ SOLN
INTRAMUSCULAR | Status: AC
  Filled 2013-12-13: qty 2

## 2013-12-13 NOTE — Op Note (Signed)
OPERATIVE REPORT  DATE OF OPERATION: 11/28/2013  PATIENT:  Danny Suarez  53 y.o. male  PRE-OPERATIVE DIAGNOSIS:  Feeding failure  POST-OPERATIVE DIAGNOSIS:  Inability to swallow  PROCEDURE:  Procedure(s): ESOPHAGOGASTRODUODENOSCOPY (EGD) PERCUTANEOUS ENDOSCOPIC GASTROSTOMY (PEG) PLACEMENT  SURGEON:  Surgeon(s): Gwenyth Ober, MD  ASSISTANT: Reibock, NP-C  ANESTHESIA:   local and IV sedation  EBL: <20 ml  BLOOD ADMINISTERED: none  DRAINS: Gastrostomy Tube   SPECIMEN:  No Specimen  COUNTS CORRECT:  YES  PROCEDURE DETAILS: The procedure was performed at the patient's bedside on 3 S.  A proper time out was performed identifying the patient and the procedure to be performed the he was given IV sedation and subsequently a Pentax 2990 scope was passed over inflated the patient's upper esophagus. We passed it down to and through the stomach into the pylorus and then subsequently the duodenum were pictures are taken. We subsequently grew retracted the scope back into the body of the stomach where we could see the impulse of the assistance of finger on the anterior abdominal wall.  An incision was made approximately 3 cm below the left costal margin and an attempt was made to pass angiocatheter into the stomach in that position however with minimal success. We subsequently placed incision more at about 1/2 cm below the left costal margin where we could pass a catheter into the stomach. The incision made for the initial attempt was subsequently closed with stainless steel staples. The angiocatheter allowed the passage of a looped the blue wire which was subsequently brought out through the patient's mouth and looped around the pull-through gastrostomy tube was then brought out to the intra-abdominal wall. Pictures are taken of the gastrostomy tube proper position.  All counts were correct. The G-tube was supported and  Secured in place in the usual manner. All counts were  correct.   PATIENT DISPOSITION:  Remained in SDU sedated but not paralyzed in stable condition.   [name] O 1/30/201510:08 AM

## 2013-12-13 NOTE — Progress Notes (Signed)
PT Cancellation Note  Patient Details Name: Danny Suarez MRN: [id number] DOB: 1961/02/07   Cancelled Treatment:    Reason Eval/Treat Not Completed: Fatigue/lethargy limiting ability to participate. Pt heavily sedated due to insertion of PEG. Unable to participate with therapy today. Will re-attempt at later time.    Elie Confer [place], [place] 12/04/2013, 2:38 PM

## 2013-12-13 NOTE — Progress Notes (Signed)
PEG tube placed at bedside. Pt tolerated procedure well. Md order to hold tube feeds for 24hrs and to keep PEG to gravity drain. Will continue to monitor.

## 2013-12-13 NOTE — Progress Notes (Addendum)
NUTRITION FOLLOW UP  Intervention:   Pt is better suited for alternative formula, given need for long-term feeding and no indication for peptide-based, specialty enteral formula at this time. Once PEG is placed and ready to resume feedings, recommend initiation of Osmolite 1.2 at 30 ml/hr via PEG, advance by 10 ml q 4 hours to goal of 60 ml/hr. Add 60 ml Prostat liquid protein via tube BID. Goal regimen will provide: 2128 kcal, 140 grams protein, and 1181 ml free water. Recommend free water flushes of 240 ml water QID via PEG. RD to continue to follow nutrition care plan.  NUTRITION DIAGNOSIS:  Inadequate oral intake related to inability to eat as evidenced by NPO status; ongoing.   Goal:  Intake to meet >90% of estimated nutrition needs. Not met at this time 2/2 TF on hold.  Monitor:  TF tolerance, weight trend, labs  Assessment:   Pt admitted due to abnormal MRI and CT of his brain 1/13. 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 Pt had craniotomy for resection/biopsy on 1/14. Per MRI 1/16 pt with acute strokes (right basal ganglia, right thalamus, and inferior right frontal lobe).   Pt extubated 1/18.   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 Per neurosurgeon, plan to refer patient for medical and radiation oncology evaluation in 2 weeks, and noted that pt would likely not benefit from repeat resection.  Pt's tube feedings are being held at this time as pt is planning to go to OR for PEG tube placement. Plan is for patient to return to SNF once PEG is placed. Pt was previously receiving Vital High Protein at 85 ml/hr. Pt is better suited for alternative formula, given need for long-term feeding and no indication for peptide-based, specialty formula at this time.  BSE completed by SLP on 1/27, recommending FEES and continued NPO status.  Height: Ht Readings from Last 1 Encounters:  11/14/2013 _0  (1.88 m)    Weight Status:   Wt Readings from  Last 1 Encounters:  11/28/2013 280 lb 10.3 oz (127.3 kg)  Admission weight 299 lb (135.6 kg)  Body mass index is 36.02 kg/(m^2). Obese Class II  Re-estimated needs:  Kcal: 2000 - 2200 Protein: >/= 130 - 145 grams  Fluid: > 2 L/day  Skin: head and knee incisions  Diet Order:   NPO   Intake/Output Summary (Last 24 hours) at 12/11/2013 1042 Last data filed at 11/17/2013 0600  Gross per 24 hour  Intake    870 ml  Output    625 ml  Net    245 ml    Last BM: 1/28 ("smear")  Labs:   Recent Labs Lab 12/07/13 0500 12/08/13 0250 12/09/13 0305  NA 141 140 142  K 4.4 4.5 4.5  CL 101 99 102  CO2 _1 BUN 34* 32* 30*  CREATININE 0.73 0.73 0.68  CALCIUM 8.7 8.7 8.6  MG 2.3 2.1 2.2  PHOS 3.5 3.2 3.0  GLUCOSE 141* 227* 199*    CBG (last 3)   Recent Labs  12/12/13 2323 12/11/2013 0346 12/11/2013 0744  GLUCAP 126* 141* 117*    Scheduled Meds: . fentaNYL      . antiseptic oral rinse  15 mL Mouth Rinse q12n4p  .  ceFAZolin (ANCEF) IV  1 g Intravenous Once  . cefOXitin  1 g Intravenous Once  . chlorhexidine  15 mL Mouth Rinse BID  . cholecalciferol  400 Units Oral Daily  . dexamethasone  0.5 mg Per Tube BID  . docusate  100 mg Oral BID  . insulin aspart  0-20 Units Subcutaneous Q4H  . insulin glargine  10 Units Subcutaneous QHS  . levETIRAcetam  500 mg Intravenous Q12H  . sodium chloride  10-40 mL Intracatheter Q12H    Continuous Infusions: . sodium chloride 20 mL/hr at 12/12/13 1900    Inda Coke MS, RD, LDN Pager: [phone] After-hours pager: [phone]

## 2013-12-14 LAB — GLUCOSE, CAPILLARY
GLUCOSE-CAPILLARY: 125 mg/dL — AB (ref 70–99)
Glucose-Capillary: 123 mg/dL — ABNORMAL HIGH (ref 70–99)
Glucose-Capillary: 116 mg/dL — ABNORMAL HIGH (ref 70–99)
Glucose-Capillary: 148 mg/dL — ABNORMAL HIGH (ref 70–99)
Glucose-Capillary: 138 mg/dL — ABNORMAL HIGH (ref 70–99)
Glucose-Capillary: 143 mg/dL — ABNORMAL HIGH (ref 70–99)
Glucose-Capillary: 147 mg/dL — ABNORMAL HIGH (ref 70–99)

## 2013-12-14 MED ORDER — VITAL HIGH PROTEIN PO LIQD
1000.0000 mL | ORAL | Status: DC
  Administered 2013-12-16: 1000 mL
  Filled 2013-12-14 (×7): qty 1000

## 2013-12-14 MED ORDER — WHITE PETROLATUM GEL
Status: AC
  Administered 2013-12-14: 22:00:00
  Filled 2013-12-14: qty 5

## 2013-12-14 NOTE — Progress Notes (Signed)
Patient ID: Danny Suarez, male   DOB: 29-May-1961, 53 y.o.   MRN: [id number] Peg site clean, can start tube feeds per nutrition, will sign off

## 2013-12-14 NOTE — Progress Notes (Signed)
Patient ID: Danny Suarez, male   DOB: November 10, 1961, 53 y.o.   MRN: [id number] No significant change neurologically patient remains arousable voice and follows commands with great stimulation wound appears to be clean and dry

## 2013-12-15 LAB — GLUCOSE, CAPILLARY
GLUCOSE-CAPILLARY: 141 mg/dL — AB (ref 70–99)
Glucose-Capillary: 149 mg/dL — ABNORMAL HIGH (ref 70–99)
Glucose-Capillary: 154 mg/dL — ABNORMAL HIGH (ref 70–99)
Glucose-Capillary: 155 mg/dL — ABNORMAL HIGH (ref 70–99)
Glucose-Capillary: 155 mg/dL — ABNORMAL HIGH (ref 70–99)
Glucose-Capillary: 167 mg/dL — ABNORMAL HIGH (ref 70–99)
Glucose-Capillary: 169 mg/dL — ABNORMAL HIGH (ref 70–99)

## 2013-12-15 NOTE — Progress Notes (Signed)
Patient ID: Danny Suarez, male   DOB: September 02, 1961, 53 y.o.   MRN: [id number] No significant change arouses to voice awakens but does not follow commands

## 2013-12-15 DEATH — deceased

## 2013-12-16 ENCOUNTER — Inpatient Hospital Stay (HOSPITAL_COMMUNITY): Payer: BC Managed Care – PPO

## 2013-12-16 ENCOUNTER — Encounter (HOSPITAL_COMMUNITY): Payer: Self-pay | Admitting: Radiology

## 2013-12-16 DIAGNOSIS — A419 Sepsis, unspecified organism: Secondary | ICD-10-CM

## 2013-12-16 LAB — URINALYSIS, ROUTINE W REFLEX MICROSCOPIC
GLUCOSE, UA: NEGATIVE mg/dL
HGB URINE DIPSTICK: NEGATIVE
Ketones, ur: NEGATIVE mg/dL
Nitrite: NEGATIVE
PROTEIN: 30 mg/dL — AB
Specific Gravity, Urine: 1.031 — ABNORMAL HIGH (ref 1.005–1.030)
Urobilinogen, UA: 2 mg/dL — ABNORMAL HIGH (ref 0.0–1.0)
pH: 5 (ref 5.0–8.0)

## 2013-12-16 LAB — URINE MICROSCOPIC-ADD ON

## 2013-12-16 LAB — GLUCOSE, CAPILLARY
GLUCOSE-CAPILLARY: 207 mg/dL — AB (ref 70–99)
Glucose-Capillary: 205 mg/dL — ABNORMAL HIGH (ref 70–99)
Glucose-Capillary: 172 mg/dL — ABNORMAL HIGH (ref 70–99)
Glucose-Capillary: 155 mg/dL — ABNORMAL HIGH (ref 70–99)
Glucose-Capillary: 198 mg/dL — ABNORMAL HIGH (ref 70–99)
Glucose-Capillary: 175 mg/dL — ABNORMAL HIGH (ref 70–99)

## 2013-12-16 LAB — BASIC METABOLIC PANEL
BUN: 63 mg/dL — ABNORMAL HIGH (ref 6–23)
CALCIUM: 8.9 mg/dL (ref 8.4–10.5)
CO2: 25 mEq/L (ref 19–32)
Chloride: 110 mEq/L (ref 96–112)
Creatinine, Ser: 1.76 mg/dL — ABNORMAL HIGH (ref 0.50–1.35)
GFR calc Af Amer: 50 mL/min — ABNORMAL LOW (ref >90)
GFR, EST NON AFRICAN AMERICAN: 43 mL/min — AB (ref >90)
Glucose, Bld: 233 mg/dL — ABNORMAL HIGH (ref 70–99)
Potassium: 4.6 mEq/L (ref 3.7–5.3)
SODIUM: 153 meq/L — AB (ref 137–147)

## 2013-12-16 LAB — CBC
HCT: 40.2 % (ref 39.0–52.0)
Hemoglobin: 13.1 g/dL (ref 13.0–17.0)
MCH: 28.2 pg (ref 26.0–34.0)
MCHC: 32.6 g/dL (ref 30.0–36.0)
MCV: 86.6 fL (ref 78.0–100.0)
PLATELETS: 231 10*3/uL (ref 150–400)
RBC: 4.64 MIL/uL (ref 4.22–5.81)
RDW: 14.3 % (ref 11.5–15.5)
WBC: 20.1 10*3/uL — ABNORMAL HIGH (ref 4.0–10.5)

## 2013-12-16 LAB — LACTIC ACID, PLASMA: LACTIC ACID, VENOUS: 2.7 mmol/L — AB (ref 0.5–2.2)

## 2013-12-16 LAB — PROCALCITONIN: PROCALCITONIN: 0.63 ng/mL

## 2013-12-16 MED ORDER — PRO-STAT SUGAR FREE PO LIQD
60.0000 mL | Freq: Two times a day (BID) | ORAL | Status: DC
  Administered 2013-12-16 – 2013-12-17 (×2): 60 mL
  Filled 2013-12-16 (×6): qty 60

## 2013-12-16 MED ORDER — VANCOMYCIN HCL 10 G IV SOLR
1500.0000 mg | Freq: Once | INTRAVENOUS | Status: AC
  Administered 2013-12-16: 1500 mg via INTRAVENOUS
  Filled 2013-12-16: qty 1500

## 2013-12-16 MED ORDER — DEXMEDETOMIDINE HCL IN NACL 200 MCG/50ML IV SOLN
0.0000 ug/kg/h | INTRAVENOUS | Status: DC
  Administered 2013-12-16: 0.5 ug/kg/h via INTRAVENOUS
  Administered 2013-12-16: 0.6 ug/kg/h via INTRAVENOUS
  Administered 2013-12-16: 0.4 ug/kg/h via INTRAVENOUS
  Administered 2013-12-16: 0.6 ug/kg/h via INTRAVENOUS
  Administered 2013-12-17: 0.2 ug/kg/h via INTRAVENOUS
  Filled 2013-12-16 (×5): qty 50

## 2013-12-16 MED ORDER — HEPARIN (PORCINE) IN NACL 100-0.45 UNIT/ML-% IJ SOLN
1800.0000 [IU]/h | INTRAMUSCULAR | Status: DC
  Administered 2013-12-17: 1950 [IU]/h via INTRAVENOUS
  Administered 2013-12-17: 1500 [IU]/h via INTRAVENOUS
  Administered 2013-12-18: 1950 [IU]/h via INTRAVENOUS
  Administered 2013-12-18 – 2013-12-23 (×11): 2050 [IU]/h via INTRAVENOUS
  Administered 2013-12-23 – 2013-12-24 (×2): 1800 [IU]/h via INTRAVENOUS
  Filled 2013-12-16 (×19): qty 250

## 2013-12-16 MED ORDER — FENTANYL CITRATE 0.05 MG/ML IJ SOLN
100.0000 ug | INTRAMUSCULAR | Status: DC | PRN
  Administered 2013-12-16 (×2): 100 ug via INTRAVENOUS
  Filled 2013-12-16: qty 2

## 2013-12-16 MED ORDER — FENTANYL CITRATE 0.05 MG/ML IJ SOLN
100.0000 ug | INTRAMUSCULAR | Status: DC | PRN
  Administered 2013-12-16: 50 ug via INTRAVENOUS
  Administered 2013-12-16: 100 ug via INTRAVENOUS
  Administered 2013-12-17: 50 ug via INTRAVENOUS
  Administered 2013-12-17 – 2013-12-24 (×8): 100 ug via INTRAVENOUS
  Filled 2013-12-16 (×12): qty 2

## 2013-12-16 MED ORDER — PIPERACILLIN-TAZOBACTAM 3.375 G IVPB
3.3750 g | Freq: Three times a day (TID) | INTRAVENOUS | Status: DC
  Administered 2013-12-16 – 2013-12-18 (×8): 3.375 g via INTRAVENOUS
  Filled 2013-12-16 (×10): qty 50

## 2013-12-16 MED ORDER — ETOMIDATE 2 MG/ML IV SOLN
20.0000 mg | Freq: Once | INTRAVENOUS | Status: AC
  Administered 2013-12-16: 20 mg via INTRAVENOUS

## 2013-12-16 MED ORDER — FREE WATER
500.0000 mL | Freq: Three times a day (TID) | Status: DC
  Administered 2013-12-16 – 2013-12-19 (×8): 500 mL

## 2013-12-16 MED ORDER — ACETAMINOPHEN 10 MG/ML IV SOLN
1000.0000 mg | Freq: Four times a day (QID) | INTRAVENOUS | Status: AC
  Administered 2013-12-16 – 2013-12-17 (×4): 1000 mg via INTRAVENOUS
  Filled 2013-12-16 (×5): qty 100

## 2013-12-16 MED ORDER — FENTANYL CITRATE 0.05 MG/ML IJ SOLN: 100.0000 ug | INTRAMUSCULAR | Status: DC | PRN

## 2013-12-16 MED ORDER — PANTOPRAZOLE SODIUM 40 MG IV SOLR
40.0000 mg | Freq: Every day | INTRAVENOUS | Status: DC
  Administered 2013-12-16 – 2013-12-22 (×7): 40 mg via INTRAVENOUS
  Filled 2013-12-16 (×7): qty 40

## 2013-12-16 MED ORDER — DOCUSATE SODIUM 50 MG/5ML PO LIQD
100.0000 mg | Freq: Two times a day (BID) | ORAL | Status: DC
  Administered 2013-12-17 – 2013-12-18 (×3): 100 mg
  Filled 2013-12-16 (×7): qty 10

## 2013-12-16 MED ORDER — IBUPROFEN 100 MG/5ML PO SUSP
400.0000 mg | Freq: Four times a day (QID) | ORAL | Status: DC | PRN
  Administered 2013-12-16: 400 mg via ORAL
  Filled 2013-12-16 (×2): qty 20

## 2013-12-16 MED ORDER — LEVOFLOXACIN IN D5W 750 MG/150ML IV SOLN
750.0000 mg | INTRAVENOUS | Status: DC
  Administered 2013-12-16 – 2013-12-18 (×3): 750 mg via INTRAVENOUS
  Filled 2013-12-16 (×3): qty 150

## 2013-12-16 MED ORDER — FENTANYL CITRATE 0.05 MG/ML IJ SOLN
INTRAMUSCULAR | Status: AC
  Filled 2013-12-16: qty 4

## 2013-12-16 MED ORDER — OSMOLITE 1.2 CAL PO LIQD
1000.0000 mL | ORAL | Status: DC
  Administered 2013-12-16: 1000 mL
  Filled 2013-12-16 (×5): qty 1000

## 2013-12-16 MED ORDER — IBUPROFEN 800 MG/8ML IV SOLN
800.0000 mg | Freq: Once | INTRAVENOUS | Status: AC
  Administered 2013-12-16: 800 mg via INTRAVENOUS
  Filled 2013-12-16: qty 8

## 2013-12-16 MED ORDER — MIDAZOLAM HCL 2 MG/2ML IJ SOLN: 2.0000 mg | Freq: Once | INTRAMUSCULAR | Status: AC

## 2013-12-16 MED ORDER — IOHEXOL 350 MG/ML SOLN
100.0000 mL | Freq: Once | INTRAVENOUS | Status: AC | PRN
  Administered 2013-12-16: 100 mL via INTRAVENOUS

## 2013-12-16 MED ORDER — VANCOMYCIN HCL 10 G IV SOLR
1250.0000 mg | Freq: Two times a day (BID) | INTRAVENOUS | Status: DC
  Administered 2013-12-17 – 2013-12-19 (×5): 1250 mg via INTRAVENOUS
  Filled 2013-12-16 (×5): qty 1250

## 2013-12-16 MED ORDER — MIDAZOLAM HCL 2 MG/2ML IJ SOLN
INTRAMUSCULAR | Status: AC
  Administered 2013-12-16: 2 mg
  Filled 2013-12-16: qty 2

## 2013-12-16 MED ORDER — MIDAZOLAM HCL 2 MG/2ML IJ SOLN
INTRAMUSCULAR | Status: AC
  Filled 2013-12-16: qty 4

## 2013-12-16 NOTE — Progress Notes (Signed)
ANTIBIOTIC CONSULT NOTE - INITIAL  Pharmacy Consult for Vancomycin, Levaquin, Zosyn Indication: rule out sepsis  Allergies  Allergen Reactions  . Mobic [Meloxicam]   . Nuvigil [Armodafinil]   . Prednisolone     Patient Measurements: Height: 6\' 2"  (188 cm) Weight: 280 lb 13.9 oz (127.4 kg) IBW/kg (Calculated) : 82.2  Labs:  Microbiology: Recent Results (from the past 720 hour(s))  MRSA PCR SCREENING     Status: None   Collection Time    12/02/2013 10:55 PM      Result Value Range Status   MRSA by PCR NEGATIVE  NEGATIVE Final   Comment:            The GeneXpert MRSA Assay (FDA     approved for NASAL specimens     only), is one component of a     comprehensive MRSA colonization     surveillance program. It is not     intended to diagnose MRSA     infection nor to guide or     monitor treatment for     MRSA infections.    Medical History: Past Medical History  Diagnosis Date  . Sleep apnea     Assessment: 53 year old male s/p tumor resection now with fever.  Beginning broad spectrum antibiotics for rule out sepsis.  Renal function stable.  Goal of Therapy:  Vancomycin trough level 15-20 mcg/ml Appropriate Zosyn, Levaquin dosing  Plan:  1) Vancomycin 1500 mg iv x 1 now 2) Vancomycin 1250 mg iv Q 12 hours 3) Levaquin 750 mg iv Q 24 hours 4) Zosyn 3.375 grams iv Q 8 hours - 4 hr infusion 5) Follow up Scr, cultures, fever trend  Thank you. Anette Guarneri, PharmD [phone]   Tad Moore 12/16/2013,2:54 PM

## 2013-12-16 NOTE — Progress Notes (Signed)
Pt GCS 3, Neuro MD notified, PCCM at [facility], pt with orders to tx to ICU, nursing will cont to monitor

## 2013-12-16 NOTE — Op Note (Signed)
PREOP DX: Hydrocephalus  POSTOP DX: Same  PROCEDURE: Left frontal ventriculostomy   SURGEON: Dr. Consuella Lose, MD  ANESTHESIA: IV Sedation (versed and fentanyl) with Local  EBL: Minimal  SPECIMENS: None  COMPLICATIONS: None  CONDITION: Hemodynamically stable  INDICATIONS: Mrs. Danny Suarez is a 53 y.o. male admitted after craniotomy for GBM. He has had a prolonged postoperative recovery and this morning underwent CT head demonstrating progressive ventriculomegaly. He remained very lethargic and therefore CSF drainage was indicated. The procedure was explained in detail to the patient's wife including risks and benefits. After her questions were answered, consent was obtained and witnessed.  PROCEDURE IN DETAIL: After consent was obtained from the patient's family, skin of the left frontal scalp was clipped, prepped and draped in the usual sterile fashion.  Scalp was then infiltrated with local anesthetic with epinephrine.  Skin incision was made sharply, and twist drill burr hole was made.  The dura was then incised, and the ventricular catheter was passed in one attempt into the left lateral ventricle.  Good CSF flow was obtained.  The catheter was then tunneled subcutaneously and connected to a drainage system and the skin incision closed.  The drain was then secured in place.  FINDINGS: 1. Opening pressure ~5cmH2O 2. Clear CSF

## 2013-12-16 NOTE — Procedures (Addendum)
Intubation Procedure Note Danny Suarez [id number] [birth year]  Procedure: Intubation Indications: Airway protection and maintenance  Procedure Details Consent: Risks of procedure as well as the alternatives and risks of each were explained to the (patient/caregiver).  Consent for procedure obtained. Time Out: Verified patient identification, verified procedure, site/side was marked, verified correct patient position, special equipment/implants available, medications/allergies/relevent history reviewed, required imaging and test results available.  Performed  Maximum sterile technique was used including gloves.  MAC and 4    Evaluation Hemodynamic Status: BP stable throughout; O2 sats: stable throughout Patient's Current Condition: stable Complications: No apparent complications Patient did tolerate procedure well. Chest X-ray ordered to verify placement.  CXR: pending.   Marina Gravel [facility] 12/16/2013

## 2013-12-16 NOTE — Progress Notes (Addendum)
Connected pt to cooling blanket, rectal temp 105. Dr Annie Sable notified through [place] in E link. Ice packs added, antibiotics infusing.

## 2013-12-16 NOTE — Progress Notes (Signed)
Patient transported to CT and back to 3M04 without any complications.

## 2013-12-16 NOTE — Progress Notes (Signed)
Pt remains tachypneic/labored despite fentanyl and precedex. Reported to e link.

## 2013-12-16 NOTE — Consult Note (Signed)
PULMONARY / CRITICAL CARE MEDICINE  Name: Danny Suarez MRN: [id number] DOB: [birth year]    ADMISSION DATE:  12/11/2013 CONSULTATION DATE:  11/28/12  REFERRING MD :  Neurosurgery PRIMARY SERVICE: Neurosurgery   CHIEF COMPLAINT:  Resp failure  BRIEF PATIENT DESCRIPTION: 53 yo admitted s/p R craniotomy for GBM resection / biopsy on 1/14. Was in ICU initially post-op, and eventually was sent to step down. 2/02 transferred back to ICU with resp distress, AMS and fever. Intubated  SIGNIFICANT EVENTS / STUDIES:  1/13   Admit for planned craniotomy after abnormal MRI / CT 1/14   Craniotomy / tumor resection / bx per Dr. Kathyrn Sheriff 1/31   PEG placement 2/2     Acute respiratory failure - transferred to ICU, intubated 2/2     CT head - evolving inferior right frontal lobe infarct.  LINES / TUBES: ETT 1/15 >>> 1/17 RIJ CVL 1/14 >>  PEG 1/31 >>  ETT 2/02 >>   CULTURES: Urine 2/02 >>  resp 2/02 >>  Blood 2/02 >>    ANTIBIOTICS: Ancef 1/14 >>>1/15 Zosyn 2/2 >>  Vancomycin 2/2 >>  Levaquin 2/2 >>   HISTORY OF PRESENT ILLNESS: 53 y/o M, with little medical history who was admitted on 1/13 for planned craniotomy. Patient underwent a left knee replacement approx 2 weeks prior to admit. Wife noticed post surgery that the patient was exhibiting peculiar behavior and train of thought. 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 He was referred to Dr. Kathyrn Sheriff and was scheduled for tumor resection and biopsy. Patient underwent biopsy on 1/14 and was returned to ICU on vent post-op. Patient improved post op and was eventually transferred to the SDU 1/27.He had a PEG placed 1/31. 2/1 patient was arousable to voice, would awaken, but did not follow commands which appears to be a change from the several days prior. 2/2 He became febrile and dyspneic. PCCM has been asked to see.   Review of Systems:  UNABLE - OBTUNDED   SUBJECTIVE:      VITAL SIGNS: Temp:  [98.9 F (37.2 C)-103.3 F (39.6 C)] 102.8 F (39.3 C) (02/02 1048) Pulse Rate:  [101-116] 115 (02/02 0920) Resp:  [27-34] 34 (02/02 1046) BP: (105-143)/(70-92) 109/89 mmHg (02/02 1046) SpO2:  [89 %-96 %] 95 % (02/02 0731) Weight:  [127.4 kg (280 lb 13.9 oz)] 127.4 kg (280 lb 13.9 oz) (02/02 0310)    INTAKE / OUTPUT: Intake/Output     02/01 0701 - 02/02 0700 02/02 0701 - 02/03 0700   I.V. (mL/kg) 480 (3.8) 20 (0.2)   Other 150 20   NG/GT 2135 93   IV Piggyback 105    Total Intake(mL/kg) 2870 (22.5) 133 (1)   Urine (mL/kg/hr) 1520 (0.5) 125 (0.1)   Total Output 1520 125   Net +1350 +8        Stool Occurrence 1 x     PHYSICAL EXAMINATION: General:  Minimally responsive, severe respiratory distress Neuro: Obtunded, Opens eyes to verbal, no verbal response. Does not follow commands. HEENT: Post craniotomy changes, incision c/d/i Cardiovascular:  Regular, no murmurs Lungs: bilateral rhonchi Abdomen:  Soft, non tender Musculoskeletal:  Minimal symmetric ankle edema Skin:  L knee surgical incision site, R crani surgical incision site. both c,d,i  CBC No results found for this basename: WBC, HGB, HCT, PLT,  in the last 168 hours Coag's No results found for this basename: APTT, INR,  in the last 168 hours  BMET No results found for this basename: NA, K, CL, CO2, BUN, CREATININE, GLUCOSE,  in the last 168 hours Electrolytes No results found for this basename: CALCIUM, MG, PHOS,  in the last 168 hours Sepsis Markers No results found for this basename: LATICACIDVEN, PROCALCITON, O2SATVEN,  in the last 168 hours ABG No results found for this basename: PHART, PCO2ART, PO2ART,  in the last 168 hours Liver Enzymes No results found for this basename: AST, ALT, ALKPHOS, BILITOT, ALBUMIN,  in the last 168 hours Cardiac Enzymes No results found for this basename: TROPONINI, PROBNP,  in the last 168 hours Glucose  Recent Labs Lab 12/15/13 1537  12/15/13 1939 12/15/13 2317 12/16/13 0349 12/16/13 0731 12/16/13 1050  GLUCAP 149* 169* 205* 172* 207* 155*   CXR: Low lung volumes with perihilar atelectasis   ASSESSMENT / PLAN:  PULMONARY A: Acute Respiratory Failure - In setting of new LLL opacity, fever, favor infective etiology.  PNA (? Aspiration) vs PE (not on chemical ppx, ? dvt post op knee surg in early jan.) OSA on home CPAP  P:   Intubation Full mechanical support Pulmonary Hygeine F/u ABG  F/u CXR CT chest   NEUROLOGIC A:   Glioblastoma multiforme s/p craniotomy, evolving CVA Acute encephalopathy  P:   Sedation while on vent GBM per neurosurgery Supportive Care  CARDIOVASCULAR A:  SIRS/sepsis: likely pulm source, but cxr not impressive   P:  Check Lactate  RENAL A:   No known issues   P:   Stat BMP  INFECTIOUS A:   Sepsis - potential sources include PNA (?aspiration) and Right IJ CVL Fever   P:   ABX as above Consider RIJ CVL removal Cooling blanket Fever control. Trend WBC and fever curve Trend PCT F/u cultures  GASTROINTESTINAL A:    Nutrition GI Px  P:   TF per Nutritionist Protonix  HEMATOLOGIC A:   Wafarin at home prior to surgery (DVT Px post knee replacement?) DVT Px  P:   Stat CBC SCDs  ENDOCRINE A:  Hyperglycemia  P:   CBG testing SSI Lantus  Today's Summary: Patient developed fever and increased dyspnea requiring ICU transfer and mechanical ventilation.   Georgann Housekeeper, ACNP [place] [facility]/Critical [facility] Pager [phone] or [phone]  PCCM ATTENDING: I have interviewed and examined the patient and reviewed the database. I have formulated the assessment and plan as reflected in the note above with amendments made by me. 40 mins of direct critical care time provided  Merton Border, MD;  PCCM service; Mobile [phone]  12/16/2013, 2:33 PM

## 2013-12-16 NOTE — Progress Notes (Signed)
PT Cancellation Note  Patient Details Name: Cataldo Cosgriff MRN: [id number] DOB: 1961-02-18   Cancelled Treatment:    Reason Eval/Treat Not Completed: Patient not medically ready . Pt now in BiPap with 102 temperature. RN deferred this date due to declining medical status. PT to return as able if appropriate.   Wood Novacek, Knute Neu 12/16/2013, 10:05 AM

## 2013-12-16 NOTE — Progress Notes (Signed)
Pt tx 79M per MD order, pt tol well, pt's family notified by PCCm NP, report called to receiving RN, all questions answered

## 2013-12-16 NOTE — Progress Notes (Signed)
Pt seen and examined. Events of this afternoon reviewed.  LABS: Na 153 Cr 1.76 WBC 20.1  IMAGING: CTH demonstrates progression in ventriculomegaly in comparison to prior MRI.   IMPRESSION: - 53 y.o. man s/p crani for GBM with continued lethargy and new respiratory failure requiring intubation and mech ventilation - Hypernatremia  - Possible pneumonia vs PE - HCP may be partially responsible for lethargy  PLAN: - Left frontal EVD - CTA Chest - Cont Abx - Free water by PEG 500 Q8  I spoke with the patient's wife and reviewed the current situation. I reviewed the CT findings and the need for ventriculostomy. Risks of the procedure were discussed and she provided consent to proceed. All her questions were answered.

## 2013-12-16 NOTE — Progress Notes (Signed)
Pt GCS 7, cont to have temp uncontrolled by meds, RN notifed Neuro MD, nursing will cont to monitor

## 2013-12-16 NOTE — Progress Notes (Signed)
Pt seen and examined. Febrile early this am, cultures ordered.   EXAM: Temp:  [98.9 F (37.2 C)-103.3 F (39.6 C)] 103 F (39.4 C) (02/02 0723) Pulse Rate:  [95-116] 115 (02/02 0500) Resp:  [24-34] 34 (02/02 0500) BP: (105-143)/(70-92) 105/70 mmHg (02/02 0310) SpO2:  [89 %-96 %] 95 % (02/02 0500) Weight:  [127.4 kg (280 lb 13.9 oz)] 127.4 kg (280 lb 13.9 oz) (02/02 0310) Intake/Output     02/01 0701 - 02/02 0700 02/02 0701 - 02/03 0700   I.V. (mL/kg) 480 (3.8)    Other 150    NG/GT 2135    IV Piggyback 105    Total Intake(mL/kg) 2870 (22.5)    Urine (mL/kg/hr) 1520 (0.5) 50 (0.4)   Total Output 1520 50   Net +1350 -50        Stool Occurrence 1 x     Drowsy but arousable Right CNIII stable Follows commands RUE/BLE. Slight movement LUE Wound c/d/i  LABS: Lab Results  Component Value Date   CREATININE 0.68 12/09/2013   BUN 30* 12/09/2013   NA 142 12/09/2013   K 4.5 12/09/2013   CL 102 12/09/2013   CO2 29 12/09/2013   Lab Results  Component Value Date   WBC 16.5* 12/09/2013   HGB 11.4* 12/09/2013   HCT 34.5* 12/09/2013   MCV 84.4 12/09/2013   PLT 223 12/09/2013    IMPRESSION: - 53 y.o. 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

## 2013-12-16 NOTE — Progress Notes (Signed)
CT angio - Submassive PE  D/w Dr Trenton Gammon -moderate risk for itnracranial bleed next 24h with IV heparin but we both agree that risk for cardiac arrest is much much much greater than risk forr intracranaial bleed  D/w Wife Jan who seemed lost and confused and had no idea of elink system and PCCM team: Updated her about PE diagnosis and decision for IV heparin despite risks. She is agreeable with plan. Expalined husband in very critical condition. ADvised bedside family meet  Dr. Brand Males, M.D., [facility].C.P Pulmonary and Critical Care Medicine Staff Physician [place] [facility] and Critical [facility] Pager: [phone], If no answer or between  15:00h - 7:00h: call [phone]  12/16/2013 10:39 PM

## 2013-12-16 NOTE — Procedures (Signed)
Intubation Procedure Note Danny Suarez [id number] 1961-09-15  Procedure: Intubation Indications: Airway protection and maintenance  Procedure Details Consent: Unable to obtain consent because of emergent medical necessity. Time Out: Verified patient identification, verified procedure, site/side was marked, verified correct patient position, special equipment/implants available, medications/allergies/relevent history reviewed, required imaging and test results available.  Performed  Maximum sterile technique was used including gloves, hand hygiene and mask.  MAC Glidescope 4    Evaluation Hemodynamic Status: BP stable throughout; O2 sats: stable throughout Patient's Current Condition: stable Complications: No apparent complications Patient did tolerate procedure well. Chest X-ray ordered to verify placement.  CXR: tube position low-repostitioned.   Georgann Housekeeper, ACNP  Pulmonology/Critical Care Pager [phone] or [phone]    I was present for and supervised the entire procedure  Merton Border, MD ; [facility] service Mobile [phone].  After 5:30 PM or weekends, call [phone]

## 2013-12-16 NOTE — Progress Notes (Addendum)
NUTRITION FOLLOW UP  Intervention:   Pt is better suited for alternative formula, given need for long-term feeding and no indication for peptide-based, specialty enteral formula at this time. Change formula to Osmolite 1.2 at 20 ml/hr via PEG, advance by 10 ml q 4 hours to goal of 60 ml/hr. Add 60 ml Prostat liquid protein via tube BID. Goal regimen will provide: 2128 kcal, 140 grams protein, and 1181 ml free water. Recommend free water flushes of 240 ml water QID via PEG once no longer on IVF. RD to continue to follow nutrition care plan.  NUTRITION DIAGNOSIS:  Inadequate oral intake related to inability to eat as evidenced by NPO status; ongoing.   Goal:  Intake to meet >90% of estimated nutrition needs. Met.  Monitor:  TF tolerance, weight trend, labs, temperatures  Assessment:   Pt admitted due to abnormal MRI and CT of his brain 1/13. 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 Pt had craniotomy for resection/biopsy on 1/14. Per MRI 1/16 pt with acute strokes (right basal ganglia, right thalamus, and inferior right frontal lobe).   Pt extubated 1/18.   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 Per neurosurgeon, plan to refer patient for medical and radiation oncology evaluation in 2 weeks, and noted that pt would likely not benefit from repeat resection.  PEG tube placed at bedside on 1/30. Pt is receiving Vital High Protein at 85 ml/hr. Pt is better suited for alternative formula, given need for long-term feeding and no indication for peptide-based, specialty formula at this time. Discussed with RN, will change order at this time. RN notes that pt is tolerating tube feeding at this time.  BSE completed by SLP on 1/27, recommending FEES and continued NPO status.  Pt now on BiPap with 103 temperature.  Height: Ht Readings from Last 1 Encounters:  11/21/2013 6' 2"  (1.88 m)    Weight Status:   Wt Readings from Last 1 Encounters:  12/16/13 280 lb  13.9 oz (127.4 kg)  Admission weight 299 lb (135.6 kg)  Body mass index is 36.05 kg/(m^2). Obese Class II  Re-estimated needs:  Kcal: 2000 - 2200 Protein: >/= 130 - 145 grams  Fluid: > 2 L/day  Skin: head and knee incisions  Diet Order:   NPO   Intake/Output Summary (Last 24 hours) at 12/16/13 1012 Last data filed at 12/16/13 0900  Gross per 24 hour  Intake   2620 ml  Output   1395 ml  Net   1225 ml    Last BM: 2/1  Labs:  No results found for this basename: NA, K, CL, CO2, BUN, CREATININE, CALCIUM, MG, PHOS, GLUCOSE,  in the last 168 hours  CBG (last 3)   Recent Labs  12/15/13 2317 12/16/13 0349 12/16/13 0731  GLUCAP 205* 172* 207*    Scheduled Meds: . antiseptic oral rinse  15 mL Mouth Rinse q12n4p  . chlorhexidine  15 mL Mouth Rinse BID  . cholecalciferol  400 Units Oral Daily  . docusate  100 mg Oral BID  . insulin aspart  0-20 Units Subcutaneous Q4H  . insulin glargine  10 Units Subcutaneous QHS  . levETIRAcetam  500 mg Intravenous Q12H  . sodium chloride  10-40 mL Intracatheter Q12H    Continuous Infusions: . sodium chloride 20 mL/hr at 12/15/13 2235  . feeding supplement (VITAL HIGH PROTEIN) 1,000 mL (12/16/13 0600)    Inda Coke MS, RD, LDN Pager: [phone] After-hours pager: [phone]

## 2013-12-16 NOTE — Progress Notes (Signed)
Utilization review completed.  

## 2013-12-16 NOTE — Progress Notes (Signed)
ANTICOAGULATION CONSULT NOTE - Initial Consult  Pharmacy Consult for Heparin  Indication: pulmonary embolus  Allergies   Allergen  Reactions   .  Mobic [Meloxicam]    .  Nuvigil [Armodafinil]    .  Prednisolone     Patient Measurements:  Height: 6\' 4"  (193 cm)  Weight: 269 lb 10 oz (122.3 kg)  IBW/kg (Calculated) : 86.8  1.25 x IBW = 108 kg  Heparin Dosing Weight: 112 kg  Vital Signs:  Temp: 105 F (40.6 C) (02/02 1730)  Temp src: Rectal (02/02 1730)  BP: 96/66 mmHg (02/02 2200)  Pulse Rate: 94 (02/02 2200)  Labs:   Recent Labs   12/16/13 1530   HGB  13.1   HCT  40.2   PLT  231   CREATININE  1.76*    Estimated Creatinine Clearance: 70.9 ml/min (by C-G formula based on Cr of 1.76).  Medical History:  Past Medical History   Diagnosis  Date   .  Sleep apnea     Medications:  Prescriptions prior to admission   Medication  Sig  Dispense  Refill   .  Biotin 1000 MCG tablet  Take 1,000 mcg by mouth daily.     .  celecoxib (CELEBREX) 200 MG capsule  Take 200 mg by mouth daily as needed (pain).     .  CHOLECALCIFEROL PO  Take 2 tablets by mouth daily.     Marland Kitchen  GLUCOSAMINE-CHONDROITIN-MSM PO  Take 1 tablet by mouth 2 (two) times daily.     Marland Kitchen  HYDROcodone-acetaminophen (NORCO/VICODIN) 5-325 MG per tablet  Take 1 tablet by mouth every 4 (four) hours as needed for moderate pain.     .  Omega 3 1200 MG CAPS  Take 1,200 mg by mouth daily.     Marland Kitchen  warfarin (COUMADIN) 5 MG tablet  Take 5 mg by mouth daily.      Assessment:  53 yo M admitted 11/25/2013 for R craniotomy for GBM, found to have a submassive PE 2/2. Pharmacy consulted to start heparin.  Coag: PE, CCM and neurosurgery agree risk of PE outweigh risk of bleed, plan to initiate heparin no bolus, goal HL low end of range.  Goal of Therapy:  Heparin level 0.3-0.5 units/ml  Monitor platelets by anticoagulation protocol: Yes  Plan:  1. Heparin 1500 units/h  2. Check heparin level 6h after ggt starts, first with AML  3. Daily  heparin level and CBC  Thank you for allowing pharmacy to be a part of this patients care team.  Rowe Robert Pharm.D., BCPS  Clinical Pharmacist  12/16/2013 10:52 PM  Pager: [phone]  Phone: [phone]

## 2013-12-16 NOTE — Progress Notes (Signed)
While changing patient's sheets, IVC drain came out. Dr. Kathyrn Sheriff notified. No new orders received.

## 2013-12-16 NOTE — Clinical Social Work Note (Signed)
Updated clinicals faxed to [facility].  Liz Beach, [place], [employer], [phone]

## 2013-12-16 NOTE — Progress Notes (Signed)
Placed pt on CPAP per MD. No complications. Vital signs stable at this time. RT will continue to monitor.

## 2013-12-16 NOTE — Progress Notes (Signed)
Temp 105. Sounds neurogenic. Very ppor prognosis patient though full code. Creat rising   Recent Labs Lab 12/16/13 1530  CREATININE 1.76*   Estimated Creatinine Clearance: 70.9 ml/min (by C-G formula based on Cr of 1.76).   Plan iv ibrupfen 800mg  x 1 Dc po brufen Await ct chest angio  Dr. Brand Males, M.D., [facility].C.P Pulmonary and Critical Care Medicine Staff Physician [place] [facility] and Critical [facility] Pager: [phone], If no answer or between  15:00h - 7:00h: call [phone]  12/16/2013 6:24 PM

## 2013-12-17 ENCOUNTER — Inpatient Hospital Stay (HOSPITAL_COMMUNITY): Payer: BC Managed Care – PPO

## 2013-12-17 DIAGNOSIS — I2699 Other pulmonary embolism without acute cor pulmonale: Secondary | ICD-10-CM

## 2013-12-17 LAB — BASIC METABOLIC PANEL
BUN: 62 mg/dL — ABNORMAL HIGH (ref 6–23)
CO2: 20 mEq/L (ref 19–32)
Calcium: 8.4 mg/dL (ref 8.4–10.5)
Chloride: 113 mEq/L — ABNORMAL HIGH (ref 96–112)
Creatinine, Ser: 1.53 mg/dL — ABNORMAL HIGH (ref 0.50–1.35)
GFR calc Af Amer: 59 mL/min — ABNORMAL LOW (ref >90)
GFR calc non Af Amer: 51 mL/min — ABNORMAL LOW (ref >90)
Glucose, Bld: 148 mg/dL — ABNORMAL HIGH (ref 70–99)
Potassium: 4.3 mEq/L (ref 3.7–5.3)
Sodium: 153 mEq/L — ABNORMAL HIGH (ref 137–147)

## 2013-12-17 LAB — GLUCOSE, CAPILLARY
GLUCOSE-CAPILLARY: 131 mg/dL — AB (ref 70–99)
GLUCOSE-CAPILLARY: 122 mg/dL — AB (ref 70–99)
GLUCOSE-CAPILLARY: 149 mg/dL — AB (ref 70–99)
Glucose-Capillary: 120 mg/dL — ABNORMAL HIGH (ref 70–99)
Glucose-Capillary: 138 mg/dL — ABNORMAL HIGH (ref 70–99)
Glucose-Capillary: 145 mg/dL — ABNORMAL HIGH (ref 70–99)
Glucose-Capillary: 162 mg/dL — ABNORMAL HIGH (ref 70–99)

## 2013-12-17 LAB — URINE CULTURE
Colony Count: 9000
SPECIAL REQUESTS: NORMAL

## 2013-12-17 LAB — CBC
HEMATOCRIT: 38.6 % — AB (ref 39.0–52.0)
Hemoglobin: 12.7 g/dL — ABNORMAL LOW (ref 13.0–17.0)
MCH: 28.3 pg (ref 26.0–34.0)
MCHC: 32.9 g/dL (ref 30.0–36.0)
MCV: 86.2 fL (ref 78.0–100.0)
Platelets: 159 10*3/uL (ref 150–400)
RBC: 4.48 MIL/uL (ref 4.22–5.81)
RDW: 14.3 % (ref 11.5–15.5)
WBC: 18.2 10*3/uL — AB (ref 4.0–10.5)

## 2013-12-17 LAB — HEPARIN LEVEL (UNFRACTIONATED)
HEPARIN UNFRACTIONATED: 0.23 [IU]/mL — AB (ref 0.30–0.70)
Heparin Unfractionated: 0.14 IU/mL — ABNORMAL LOW (ref 0.30–0.70)
Heparin Unfractionated: 0.31 IU/mL (ref 0.30–0.70)

## 2013-12-17 LAB — PROCALCITONIN: PROCALCITONIN: 4.62 ng/mL

## 2013-12-17 MED ORDER — SODIUM CHLORIDE 0.9 % IV BOLUS (SEPSIS)
500.0000 mL | Freq: Once | INTRAVENOUS | Status: AC
  Administered 2013-12-17: 500 mL via INTRAVENOUS

## 2013-12-17 MED ORDER — DEXTROSE 5 % IV SOLN
INTRAVENOUS | Status: AC
  Administered 2013-12-17 – 2013-12-18 (×2): via INTRAVENOUS

## 2013-12-17 MED ORDER — SODIUM CHLORIDE 0.9 % IV BOLUS (SEPSIS)
250.0000 mL | Freq: Once | INTRAVENOUS | Status: AC
  Administered 2013-12-17: 250 mL via INTRAVENOUS

## 2013-12-17 NOTE — Progress Notes (Signed)
ANTICOAGULATION CONSULT NOTE - Follow Up Consult  Pharmacy Consult for Heparin Indication: pulmonary embolus  Allergies  Allergen Reactions  . Mobic [Meloxicam]   . Nuvigil [Armodafinil]   . Prednisolone     Patient Measurements: Height: 6\' 4"  (193 cm) Weight: 280 lb 10.3 oz (127.3 kg) IBW/kg (Calculated) : 86.8 Heparin Dosing Weight: 112kg  Vital Signs: Temp: 101 F (38.3 C) (02/03 2000) Temp src: Core (Comment) (02/03 2000) BP: 126/80 mmHg (02/03 2100) Pulse Rate: 99 (02/03 2100)  Labs:  Recent Labs  12/16/13 1530 12/17/13 0535 12/17/13 1300 12/17/13 2145  HGB 13.1 12.7*  --   --   HCT 40.2 38.6*  --   --   PLT 231 159  --   --   HEPARINUNFRC  --  0.14* 0.23* 0.31  CREATININE 1.76* 1.53*  --   --     Estimated Creatinine Clearance: 83.2 ml/min (by C-G formula based on Cr of 1.53).   Medications:  Heparin 1950 units/hr  Assessment: 53 yo M admitted 12/05/2013 for R craniotomy for GBM,  found to have a submassive PE 2/2 and started on heparin. Heparin level (0.31) is now therapeutic with goal at lower end of range - will continue current rate and follow-up AM heparin level. - No significant bleeding per RN - No problems with line/infusion per RN  Goal of Therapy:  Heparin level 0.3-0.5 units/ml Monitor platelets by anticoagulation protocol: Yes   Plan:  1. Continue heparin drip 1950 units/hr (19.5 ml/hr) 2. Follow-up AM heparin level and anticoagulation plans  Earleen Newport [phone] 12/17/2013,10:29 PM

## 2013-12-17 NOTE — Progress Notes (Signed)
NUTRITION FOLLOW UP  Intervention:    Recommend Vital High Protein @ 60 ml/hr with 30 ml Prostat QID.   TF regimen provides: 1840 kcal (67% of needs), 186 grams protein, and 1203 ml H2O.   NUTRITION DIAGNOSIS:  Inadequate oral intake related to inability to eat as evidenced by NPO status; ongoing.   Goal:  Enteral nutrition to provide 60-70% of estimated calorie needs (22-25 kcals/kg ideal body weight) and 100% of estimated protein needs, based on ASPEN guidelines for permissive underfeeding in critically ill obese individuals.  Monitor:  TF tolerance, weight trend, labs, temperatures  Assessment:   Pt admitted due to abnormal MRI and CT of his brain 1/13. 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 Pt had craniotomy for resection/biopsy on 1/14. Per MRI 1/16 pt with acute strokes (right basal ganglia, right thalamus, and inferior right frontal lobe).   Pt extubated 1/18.   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 Per neurosurgeon, plan to refer patient for medical and radiation oncology evaluation in 2 weeks, and noted that pt would likely not benefit from repeat resection.  PEG tube placed at bedside on 1/30.   Pt was re-intubated 2/2 and transferred to 29M. TF's held when re-intubated and not yet restarted. Spoke with RN who will address with MD.   Patient is currently intubated on ventilator support.  MV: 16.7 L/min Temp (24hrs), Avg:102 F (38.9 C), Min:100.1 F (37.8 C), Max:105 F (40.6 C)  Free water 500 ml every 8 hours provides 1500 ml  Height: Ht Readings from Last 1 Encounters:  12/16/13 6\' 4"  (1.93 m)    Weight Status:   Wt Readings from Last 1 Encounters:  12/17/13 280 lb 10.3 oz (127.3 kg)  Admission weight 299 lb (135.6 kg)  Body mass index is 34.18 kg/(m^2). Obese Class II  Re-estimated needs:  Kcal: 2761 Protein: >/= 183 grams  Fluid: > 2 L/day  Skin: head and knee incisions  Diet Order: NPO   Intake/Output  Summary (Last 24 hours) at 12/17/13 1109 Last data filed at 12/17/13 1100  Gross per 24 hour  Intake 4237.52 ml  Output    847 ml  Net 3390.52 ml    Last BM: 2/1  Labs:   Recent Labs Lab 12/16/13 1530 12/17/13 0535  NA 153* 153*  K 4.6 4.3  CL 110 113*  CO2 25 20  BUN 63* 62*  CREATININE 1.76* 1.53*  CALCIUM 8.9 8.4  GLUCOSE 233* 148*    CBG (last 3)   Recent Labs  12/17/13 0026 12/17/13 0347 12/17/13 0741  GLUCAP 145* 131* 120*    Scheduled Meds: . acetaminophen  1,000 mg Intravenous Q6H  . antiseptic oral rinse  15 mL Mouth Rinse q12n4p  . chlorhexidine  15 mL Mouth Rinse BID  . docusate  100 mg Per Tube BID  . feeding supplement (PRO-STAT SUGAR FREE 64)  60 mL Per Tube BID  . free water  500 mL Per Tube Q8H  . insulin aspart  0-20 Units Subcutaneous Q4H  . insulin glargine  10 Units Subcutaneous QHS  . levETIRAcetam  500 mg Intravenous Q12H  . levofloxacin (LEVAQUIN) IV  750 mg Intravenous Q24H  . pantoprazole (PROTONIX) IV  40 mg Intravenous Daily  . piperacillin-tazobactam (ZOSYN)  IV  3.375 g Intravenous Q8H  . sodium chloride  10-40 mL Intracatheter Q12H  . vancomycin  1,250 mg Intravenous Q12H    Continuous Infusions: . sodium chloride 20 mL/hr at 12/17/13  1100  . dexmedetomidine Stopped (12/17/13 0800)  . dextrose    . feeding supplement (OSMOLITE 1.2 CAL) 1,000 mL (12/16/13 1400)  . heparin 1,750 Units/hr (12/17/13 1100)    [place], [place], [place] Pager [phone] After Hours Pager

## 2013-12-17 NOTE — Progress Notes (Signed)
ANTICOAGULATION CONSULT NOTE - Follow Up Consult  Pharmacy Consult for heparin Indication: pulmonary embolus  Labs:  Recent Labs  12/16/13 1530 12/17/13 0535  HGB 13.1 12.7*  HCT 40.2 38.6*  PLT 231 159  HEPARINUNFRC  --  0.14*  CREATININE 1.76*  --     Assessment: 52yo male subtherapeutic on heparin with initial dosing for PE complicated by recent craniotomy, no signs of bleeding per RN.  Goal of Therapy:  Heparin level 0.3-0.5 units/ml   Plan:  Will increase heparin gtt by 2 units/kg/hr to 1750 units/hr and check level in 6hr.  Wynona Neat, PharmD, BCPS  12/17/2013,6:45 AM

## 2013-12-17 NOTE — Progress Notes (Signed)
VASCULAR LAB PRELIMINARY  PRELIMINARY  PRELIMINARY  PRELIMINARY  Bilateral lower extremity venous duplex  completed.    Preliminary report:  Right:  DVT noted in the FV and popliteal vein.  No evidence of superficial thrombosis.  No Baker's cyst.  Left: DVT noted in the FV and gastrocnemius vein.  No evidence of superficial thrombosis.  No Baker's cyst.   Magdalena Skilton, RVT 12/17/2013, 9:42 AM

## 2013-12-17 NOTE — Progress Notes (Signed)
Discussed nutritional status with MD Ramiswami, to hold off until AM.

## 2013-12-17 NOTE — Clinical Social Work Note (Signed)
Clinical Social Worker received handoff upon patient return to Neuro/Trauma ICU.  Patient is now intubated.  CSW will update facility of patient progression once appropriate.  Patient family at bedside and appropriately tearful.  CSW remains available for support and to assist with patient discharge needs once medically stable.  Danny Suarez, Danny Suarez

## 2013-12-17 NOTE — Progress Notes (Signed)
PULMONARY / CRITICAL CARE MEDICINE  Name: Danny Suarez MRN: [id number] DOB: May 27, 1961    ADMISSION DATE:  12-21-2013 CONSULTATION DATE:  11/28/12  REFERRING MD :  Neurosurgery PRIMARY SERVICE: Neurosurgery   CHIEF COMPLAINT:  Resp failure  BRIEF PATIENT DESCRIPTION: 53 yo admitted s/p R craniotomy for GBM resection / biopsy on 1/14. Was in ICU initially post-op, and eventually was sent to step down. 2/02 transferred back to ICU with resp distress, AMS and fever. Intubated  SIGNIFICANT EVENTS / STUDIES:  1/13   Admit for planned craniotomy after abnormal MRI / CT 1/14   Craniotomy / tumor resection / bx per Dr. Kathyrn Sheriff 1/31   PEG placement 2/2     Acute respiratory failure - transferred to ICU, intubated 2/2     CT head - evolving inferior right frontal lobe infarct.  LINES / TUBES: ETT 1/15 >>> 1/17 RIJ CVL 1/14 >>  PEG 1/31 >>  ETT 2/02 >>   CULTURES: Urine 2/02 >>  resp 2/02 >>  Blood 2/02 >>   ANTIBIOTICS: Ancef 1/14 >>>1/15 Zosyn 2/2 >>  Vancomycin 2/2 >>  Levaquin 2/2 >>   SUBJECTIVE:  Ventric fell out overnight  VITAL SIGNS: Temp:  [99.4 F (37.4 C)-105 F (40.6 C)] 100.3 F (37.9 C) (02/03 0700) Pulse Rate:  [29-126] 81 (02/03 0830) Resp:  [21-46] 27 (02/03 0830) BP: (86-124)/(47-97) 97/73 mmHg (02/03 0830) SpO2:  [93 %-100 %] 100 % (02/03 0830) FiO2 (%):  [40 %-60 %] 40 % (02/03 0824) Weight:  [122.3 kg (269 lb 10 oz)-127.3 kg (280 lb 10.3 oz)] 127.3 kg (280 lb 10.3 oz) (02/03 0500) Vent Mode:  [-] PSV FiO2 (%):  [40 %-60 %] 40 % Set Rate:  [16 bmp] 16 bmp Vt Set:  [550 mL] 550 mL PEEP:  [5 cmH20] 5 cmH20 Pressure Support:  [5 cmH20] 5 cmH20 Plateau Pressure:  [10 cmH20-13 cmH20] 13 cmH20  INTAKE / OUTPUT: Intake/Output     02/02 0701 - 02/03 0700 02/03 0701 - 02/04 0700   I.V. (mL/kg) 491.9 (3.9)    Other 20    NG/GT 1093    IV Piggyback 2405    Total Intake(mL/kg) 4009.9 (31.5)    Urine (mL/kg/hr) 965 (0.3)    Drains 7 (0)    Total  Output 972     Net +3037.9           PHYSICAL EXAMINATION: General:  unresponsive, synchronous with vent Neuro: Obtunded, Opens eyes to verbal, no verbal response. Does not follow commands. HEENT: Post craniotomy changes, incision c/d/i Cardiovascular:  Regular, no murmurs Lungs: bilateral rhonchi Abdomen:  Soft, non tender Musculoskeletal:  Minimal symmetric ankle edema Skin:  L knee surgical incision site, R crani surgical incision site. both c,d,i  CBC  Recent Labs Lab 12/16/13 1530 12/17/13 0535  WBC 20.1* 18.2*  HGB 13.1 12.7*  HCT 40.2 38.6*  PLT 231 159   Coag's No results found for this basename: APTT, INR,  in the last 168 hours  BMET  Recent Labs Lab 12/16/13 1530 12/17/13 0535  NA 153* 153*  K 4.6 4.3  CL 110 113*  CO2 25 20  BUN 63* 62*  CREATININE 1.76* 1.53*  GLUCOSE 233* 148*   Electrolytes  Recent Labs Lab 12/16/13 1530 12/17/13 0535  CALCIUM 8.9 8.4   Sepsis Markers  Recent Labs Lab 12/16/13 1530 12/16/13 1635 12/17/13 0535  LATICACIDVEN  --  2.7*  --   PROCALCITON 0.63  --  4.62   ABG  No results found for this basename: PHART, PCO2ART, PO2ART,  in the last 168 hours Liver Enzymes No results found for this basename: AST, ALT, ALKPHOS, BILITOT, ALBUMIN,  in the last 168 hours Cardiac Enzymes No results found for this basename: TROPONINI, PROBNP,  in the last 168 hours Glucose  Recent Labs Lab 12/16/13 1050 12/16/13 1612 12/16/13 2028 12/17/13 0026 12/17/13 0347 12/17/13 0741  GLUCAP 155* 198* 175* 145* 131* 120*   CXR: Low lung volumes with perihilar atelectasis   ASSESSMENT / PLAN:  PULMONARY A: Acute Respiratory Failure - In setting of new LLL opacity, fever, favor infective etiology.  PNA (? Aspiration) vs PE (not on chemical ppx, ? dvt post op knee surg in early jan.) OSA on home CPAP CTA with a PE P:   - Begin PS trials but no extubation given mental status. - Pulmonary Hygeine - CXR and ABG in AM. -  Continue heparin for PE.  NEUROLOGIC A:   Glioblastoma multiforme s/p craniotomy, evolving CVA Acute encephalopathy  P:   - Minimize sedation as able. - GBM per neurosurgery - Supportive Care. - ?reinsert ventric, will defer to NS.  CARDIOVASCULAR A:  SIRS/sepsis: likely pulm source, but cxr not impressive   P:  - Check Lactate.  RENAL A:   No known issues   P:   - Hypernatremia on free water 500 q8. - BMET in AM. - Replace electrolytes as indicated.  INFECTIOUS A:   Sepsis - potential sources include PNA (?aspiration) and Right IJ CVL Fever   P:   - ABX as above. - Cooling blanket. - Fever control. - F/u cultures.  GASTROINTESTINAL A:    Nutrition GI Px  P:   - TF per Nutritionist - Protonix  HEMATOLOGIC A:   Wafarin at home prior to surgery (DVT Px post knee replacement?) DVT Px  P:   - F/U CBC. - SCDs  ENDOCRINE A:  Hyperglycemia  P:   - CBG testing - SSI - Lantus  Today's Summary: Remains barely responsive, will need neurosurg to address neuro status and prognostication prior to serious consideration of extubation.  Maintain on PS for now.   CC time 35 min.  Rush Farmer, M.D. [facility] Pulmonary/Critical Care Medicine. Pager: [phone]. After hours pager: [phone].

## 2013-12-17 NOTE — Progress Notes (Signed)
ANTICOAGULATION CONSULT NOTE - Follow Up Consult  Pharmacy Consult for heparin Indication: pulmonary embolus  Labs:  Recent Labs  12/16/13 1530 12/17/13 0535 12/17/13 1300  HGB 13.1 12.7*  --   HCT 40.2 38.6*  --   PLT 231 159  --   HEPARINUNFRC  --  0.14* 0.23*  CREATININE 1.76* 1.53*  --     Assessment: 52yo male subtherapeutic on heparin with initial dosing for PE complicated by recent craniotomy.  Goal of Therapy:  Heparin level 0.3-0.5 units/ml   Plan:  Increase heparin to 1950 units / hr Heparin level at 10 pm  Thank you. Anette Guarneri, PharmD [phone]  12/17/2013,2:03 PM

## 2013-12-17 NOTE — Progress Notes (Signed)
SLP Cancellation Note  Patient Details Name: Danny Suarez MRN: [id number] DOB: October 27, 1961   Cancelled treatment:       Reason Eval/Treat Not Completed: Medical issues which prohibited therapy. Pt has declined. Please reorder SLP when medically ready.    [name], Katherene Ponto 12/17/2013, 1:12 PM

## 2013-12-17 NOTE — Progress Notes (Signed)
OT Cancellation Note  Patient Details Name: Nickolus Wadding MRN: [id number] DOB: 07-29-1961   Cancelled Treatment:    Reason Eval/Treat Not Completed: Patient not medically ready;Other (comment) Pt with medical decline. OT signing off at this time. Please reorder when pt is stable and can participate with OT. Thanks. Boyd, OTR/L  [phone] 12/17/2013 12/17/2013, 8:26 AM

## 2013-12-17 NOTE — Progress Notes (Addendum)
Pt seen and examined. Events of last 24hrs reviewed. Intubated, unable to provide history  EXAM: Temp:  [100.1 F (37.8 C)-105 F (40.6 C)] 100.6 F (38.1 C) (02/03 1600) Pulse Rate:  [29-122] 92 (02/03 1512) Resp:  [23-46] 30 (02/03 1512) BP: (86-124)/(47-97) 115/70 mmHg (02/03 1512) SpO2:  [98 %-100 %] 100 % (02/03 1512) FiO2 (%):  [40 %-60 %] 40 % (02/03 1512) Weight:  [127.3 kg (280 lb 10.3 oz)] 127.3 kg (280 lb 10.3 oz) (02/03 0500) Intake/Output     02/02 0701 - 02/03 0700 02/03 0701 - 02/04 0700   I.V. (mL/kg) 507.5 (4) 182 (1.4)   Other 20    NG/GT 1093    IV Piggyback 2405 155   Total Intake(mL/kg) 4025.5 (31.6) 337 (2.6)   Urine (mL/kg/hr) 965 (0.3) 750 (0.6)   Drains 7 (0)    Total Output 972 750   Net +3053.5 -413         Off sedation, opens eyes to voice Intubated, breathing over vent Intermittently FC LUE/LLE Wounds c/d/i  LABS: Lab Results  Component Value Date   CREATININE 1.53* 12/17/2013   BUN 62* 12/17/2013   NA 153* 12/17/2013   K 4.3 12/17/2013   CL 113* 12/17/2013   CO2 20 12/17/2013   Lab Results  Component Value Date   WBC 18.2* 12/17/2013   HGB 12.7* 12/17/2013   HCT 38.6* 12/17/2013   MCV 86.2 12/17/2013   PLT 159 12/17/2013    IMAGING: CTA Chest demonstrates large saddle PE with right-heart strain  IMPRESSION: - 53 y.o. male s/p crani for GBM, neurologically unchanged now with large saddle PE on anticoagulation - Hypernatremia - From a postoperative standpoint, the patient has made some recovery in terms of level of consciousness since surgery. He was able to say his name and follow simple commands prior to the PE. Therefore, there is the potential for some improvement in the short term in level of consciousness. However, his underlying GBM is not curable and prognosis from this remains dismal.  PLAN: - Cont current mgmt with mech ventilation and anticoagulation for PE - Given current heparin and low pressure upon insertion of EVD yesterday, will not  replace catheter - Cont free water by PEG - Consult palliative care service for goals of care discussion  I spoke with the patient's wife regarding the current situation with anticoagulation and the risk of intracranial hemorrhage given his recent surgery. I also reiterated the incurable nature of his cancer. At this point, I did ask her to consider code status.   In addition, after discussion with the PCCM team, I believe a reasonable long-term plan would be to attempt to wean the vent to extubate, with a discussion with his wife about a DNR/DNI given his long-term prognosis should he require re-intubation after that. A consult to palliative care for a discussion of the above is also reasonable.

## 2013-12-17 NOTE — Progress Notes (Signed)
PT Cancellation Note  Patient Details Name: Danny Suarez MRN: [id number] DOB: 12/11/1960   Cancelled Treatment:    Reason Eval/Treat Not Completed: Patient not medically ready. Pt now intubated and with a ventric drain and known large PE. Pt with medical decline. PT signing off at this time. Please re-consult when pt medically stable to begin participating in PT. Thanks.   Maisen Schmit, Knute Neu 12/17/2013, 9:11 AM

## 2013-12-18 ENCOUNTER — Inpatient Hospital Stay (HOSPITAL_COMMUNITY): Payer: BC Managed Care – PPO

## 2013-12-18 DIAGNOSIS — J9600 Acute respiratory failure, unspecified whether with hypoxia or hypercapnia: Secondary | ICD-10-CM

## 2013-12-18 DIAGNOSIS — A419 Sepsis, unspecified organism: Secondary | ICD-10-CM

## 2013-12-18 DIAGNOSIS — R4182 Altered mental status, unspecified: Secondary | ICD-10-CM

## 2013-12-18 LAB — BASIC METABOLIC PANEL
BUN: 34 mg/dL — AB (ref 6–23)
CO2: 24 mEq/L (ref 19–32)
Calcium: 8.1 mg/dL — ABNORMAL LOW (ref 8.4–10.5)
Chloride: 111 mEq/L (ref 96–112)
Creatinine, Ser: 1.07 mg/dL (ref 0.50–1.35)
GFR, EST NON AFRICAN AMERICAN: 79 mL/min — AB (ref >90)
GLUCOSE: 171 mg/dL — AB (ref 70–99)
Potassium: 3.3 mEq/L — ABNORMAL LOW (ref 3.7–5.3)
SODIUM: 149 meq/L — AB (ref 137–147)

## 2013-12-18 LAB — BLOOD GAS, ARTERIAL
ACID-BASE EXCESS: 0 mmol/L (ref 0.0–2.0)
BICARBONATE: 24.4 meq/L — AB (ref 20.0–24.0)
Drawn by: 39899
FIO2: 0.4 %
MECHVT: 550 mL
O2 SAT: 97.9 %
PCO2 ART: 41.2 mmHg (ref 35.0–45.0)
PEEP: 5 cmH2O
PO2 ART: 106 mmHg — AB (ref 80.0–100.0)
Patient temperature: 98.6
RATE: 16 resp/min
TCO2: 25.6 mmol/L (ref 0–100)
pH, Arterial: 7.389 (ref 7.350–7.450)

## 2013-12-18 LAB — GLUCOSE, CAPILLARY
GLUCOSE-CAPILLARY: 141 mg/dL — AB (ref 70–99)
GLUCOSE-CAPILLARY: 157 mg/dL — AB (ref 70–99)
Glucose-Capillary: 124 mg/dL — ABNORMAL HIGH (ref 70–99)
Glucose-Capillary: 118 mg/dL — ABNORMAL HIGH (ref 70–99)
Glucose-Capillary: 123 mg/dL — ABNORMAL HIGH (ref 70–99)
Glucose-Capillary: 138 mg/dL — ABNORMAL HIGH (ref 70–99)

## 2013-12-18 LAB — CBC
HEMATOCRIT: 30.7 % — AB (ref 39.0–52.0)
HEMOGLOBIN: 10 g/dL — AB (ref 13.0–17.0)
MCH: 28 pg (ref 26.0–34.0)
MCHC: 32.6 g/dL (ref 30.0–36.0)
MCV: 86 fL (ref 78.0–100.0)
Platelets: 155 10*3/uL (ref 150–400)
RBC: 3.57 MIL/uL — ABNORMAL LOW (ref 4.22–5.81)
RDW: 14.3 % (ref 11.5–15.5)
WBC: 11.9 10*3/uL — AB (ref 4.0–10.5)

## 2013-12-18 LAB — CULTURE, RESPIRATORY W GRAM STAIN

## 2013-12-18 LAB — URINE CULTURE
COLONY COUNT: NO GROWTH
Culture: NO GROWTH
SPECIAL REQUESTS: NORMAL

## 2013-12-18 LAB — HEPARIN LEVEL (UNFRACTIONATED): Heparin Unfractionated: 0.28 IU/mL — ABNORMAL LOW (ref 0.30–0.70)

## 2013-12-18 LAB — CULTURE, RESPIRATORY: SPECIAL REQUESTS: NORMAL

## 2013-12-18 LAB — MAGNESIUM: Magnesium: 2.2 mg/dL (ref 1.5–2.5)

## 2013-12-18 LAB — PHOSPHORUS: PHOSPHORUS: 1.9 mg/dL — AB (ref 2.3–4.6)

## 2013-12-18 LAB — PROCALCITONIN: PROCALCITONIN: 2.93 ng/mL

## 2013-12-18 MED ORDER — VITAL HIGH PROTEIN PO LIQD
1000.0000 mL | ORAL | Status: DC
  Administered 2013-12-18 – 2013-12-22 (×7): 1000 mL
  Filled 2013-12-18 (×9): qty 1000

## 2013-12-18 MED ORDER — DEXTROSE 5 % IV SOLN
30.0000 mmol | Freq: Once | INTRAVENOUS | Status: AC
  Administered 2013-12-18: 30 mmol via INTRAVENOUS
  Filled 2013-12-18: qty 10

## 2013-12-18 MED ORDER — POTASSIUM CHLORIDE 20 MEQ/15ML (10%) PO LIQD
40.0000 meq | Freq: Three times a day (TID) | ORAL | Status: AC
  Administered 2013-12-18 (×2): 40 meq
  Filled 2013-12-18 (×2): qty 30

## 2013-12-18 MED ORDER — FUROSEMIDE 10 MG/ML IJ SOLN
40.0000 mg | Freq: Four times a day (QID) | INTRAMUSCULAR | Status: AC
  Administered 2013-12-18 (×3): 40 mg via INTRAVENOUS
  Filled 2013-12-18 (×3): qty 4

## 2013-12-18 MED ORDER — PRO-STAT SUGAR FREE PO LIQD
30.0000 mL | Freq: Four times a day (QID) | ORAL | Status: DC
  Administered 2013-12-18 – 2013-12-23 (×21): 30 mL
  Filled 2013-12-18 (×23): qty 30

## 2013-12-18 MED ORDER — POTASSIUM CHLORIDE 10 MEQ/50ML IV SOLN
10.0000 meq | INTRAVENOUS | Status: AC
  Administered 2013-12-18 (×2): 10 meq via INTRAVENOUS
  Filled 2013-12-18 (×2): qty 50

## 2013-12-18 NOTE — Progress Notes (Signed)
[facility] ADULT ICU REPLACEMENT PROTOCOL FOR AM LAB REPLACEMENT ONLY  The patient does apply for the [facility] Adult ICU Electrolyte Replacment Protocol based on the criteria listed below:   1. Is GFR >/= 40 ml/min? yes  Patient's GFR today is 79 2. Is urine output >/= 0.5 ml/kg/hr for the last 6 hours? yes Patient's UOP is 0.63 ml/kg/hr 3. Is BUN < 60 mg/dL? yes  Patient's BUN today is 34 4. Abnormal electrolyte(s): K+ 3.3, (not able to replace Phos 1.9 with protocol) 5. Ordered repletion with: see order 6. If a panic level lab has been reported, has the CCM MD in charge been notified? yes.   Physician:  Dr. Jodi Geralds, Damacio Weisgerber A 12/18/2013 6:19 AM

## 2013-12-18 NOTE — Progress Notes (Signed)
ANTICOAGULATION CONSULT NOTE - Follow Up Consult  Pharmacy Consult for Heparin  Indication: pulmonary embolus  Allergies  Allergen Reactions  . Mobic [Meloxicam]   . Nuvigil [Armodafinil]   . Prednisolone     Labs:  Recent Labs  12/16/13 1530  12/17/13 0535 12/17/13 1300 12/17/13 2145 12/18/13 0500  HGB 13.1  --  12.7*  --   --  10.0*  HCT 40.2  --  38.6*  --   --  30.7*  PLT 231  --  159  --   --  155  HEPARINUNFRC  --   < > 0.14* 0.23* 0.31 0.28*  CREATININE 1.76*  --  1.53*  --   --  1.07  < > = values in this interval not displayed.  Estimated Creatinine Clearance: 118.4 ml/min (by C-G formula based on Cr of 1.07).   Assessment: 53 y/o M admitted 1/13 for R-craniotomy for GBM, found to have PE, started on heparin, aiming for lower end of therapeutic range (0.3-0.5)  Heparin level = 0.28 this AM  Goal of Therapy:  Heparin level 0.3-0.5 units/ml Monitor platelets by anticoagulation protocol: Yes   Plan:  -Increase heparin drip slightly to 2050 units/hr -Follow up AM labs  Thank you. Anette Guarneri, PharmD [phone] 12/18/2013,9:50 AM

## 2013-12-18 NOTE — Progress Notes (Signed)
Pt seen and examined. No issues overnight.   EXAM: Temp:  [98.6 F (37 C)-101 F (38.3 C)] 99.4 F (37.4 C) (02/04 1120) Pulse Rate:  [56-103] 91 (02/04 1120) Resp:  [22-30] 28 (02/04 1120) BP: (106-135)/(68-91) 132/85 mmHg (02/04 1120) SpO2:  [98 %-100 %] 99 % (02/04 1100) FiO2 (%):  [40 %] 40 % (02/04 1120) Weight:  [126 kg (277 lb 12.5 oz)] 126 kg (277 lb 12.5 oz) (02/04 0435) Intake/Output     02/03 0701 - 02/04 0700 02/04 0701 - 02/05 0700   I.V. (mL/kg) 2964.1 (23.5) 82 (0.7)   Other     NG/GT 1000    IV Piggyback 1103 259.5   Total Intake(mL/kg) 5067.1 (40.2) 341.5 (2.7)   Urine (mL/kg/hr) 1550 (0.5) 180 (0.3)   Drains     Total Output 1550 180   Net +3517.1 +161.5         Off sedation, opens eyes to voice, tracks Intubated, breathing over vent Follows commands BUE, R more brisk than L. FC RLE.  Wound c/d/i  LABS: Lab Results  Component Value Date   CREATININE 1.07 12/18/2013   BUN 34* 12/18/2013   NA 149* 12/18/2013   K 3.3* 12/18/2013   CL 111 12/18/2013   CO2 24 12/18/2013   Lab Results  Component Value Date   WBC 11.9* 12/18/2013   HGB 10.0* 12/18/2013   HCT 30.7* 12/18/2013   MCV 86.0 12/18/2013   PLT 155 12/18/2013    IMPRESSION: - 53 y.o. male s/p crani for GBM with PE and likely pneumonia. Neurologically stable  PLAN: - Cont vent mgmt per PCCM. Wean to extubate once code status/long-term plan established - Cont heparin gtt for PE - Free water by PEG

## 2013-12-18 NOTE — Progress Notes (Signed)
NUTRITION FOLLOW UP  Intervention:    1. Vital High Protein @ 60 ml/hr   2. 30 ml Prostat QID.   TF regimen provides: 1840 kcal (67% of needs), 186 grams protein, and 1203 ml H2O.   NUTRITION DIAGNOSIS:  Inadequate oral intake related to inability to eat as evidenced by NPO status; ongoing.   Goal:  Enteral nutrition to provide 60-70% of estimated calorie needs (22-25 kcals/kg ideal body weight) and 100% of estimated protein needs, based on ASPEN guidelines for permissive underfeeding in critically ill obese individuals.  Monitor:  TF tolerance, weight trend, labs, temperatures  Assessment:   Pt admitted due to abnormal MRI and CT of his brain 1/13. 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 Pt had craniotomy for resection/biopsy on 1/14. Per MRI 1/16 pt with acute strokes (right basal ganglia, right thalamus, and inferior right frontal lobe).   Pt extubated 1/18.   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 Per neurosurgeon, plan to refer patient for medical and radiation oncology evaluation in 2 weeks, and noted that pt would likely not benefit from repeat resection.  PEG tube placed at bedside on 1/30.   Pt was re-intubated 2/2 and transferred to 67M. TF's held when re-intubated and restarted 2/4.  Pt discussed during ICU rounds and with RN.   Patient is currently intubated on ventilator support.  MV: 14.4 L/min Temp (24hrs), Avg:100.3 F (37.9 C), Min:98.6 F (37 C), Max:101 F (38.3 C)  Free water 500 ml every 8 hours provides 1500 ml  Height: Ht Readings from Last 1 Encounters:  12/16/13 6\' 4"  (1.93 m)    Weight Status:   Wt Readings from Last 1 Encounters:  12/18/13 277 lb 12.5 oz (126 kg)  Admission weight 299 lb (135.6 kg)  Body mass index is 33.83 kg/(m^2). Obese Class II  Re-estimated needs:  Kcal: 2761 Protein: >/= 183 grams  Fluid: > 2 L/day  Skin: head and knee incisions  Diet Order: NPO   Intake/Output  Summary (Last 24 hours) at 12/18/13 1117 Last data filed at 12/18/13 1000  Gross per 24 hour  Intake 5100.6 ml  Output   1330 ml  Net 3770.6 ml    Last BM: 2/1  Labs:   Recent Labs Lab 12/16/13 1530 12/17/13 0535 12/18/13 0500  NA 153* 153* 149*  K 4.6 4.3 3.3*  CL 110 113* 111  CO2 25 20 24   BUN 63* 62* 34*  CREATININE 1.76* 1.53* 1.07  CALCIUM 8.9 8.4 8.1*  MG  --   --  2.2  PHOS  --   --  1.9*  GLUCOSE 233* 148* 171*    CBG (last 3)   Recent Labs  12/17/13 1944 12/17/13 2322 12/18/13 0317  GLUCAP 122* 149* 124*    Scheduled Meds: . antiseptic oral rinse  15 mL Mouth Rinse q12n4p  . chlorhexidine  15 mL Mouth Rinse BID  . docusate  100 mg Per Tube BID  . feeding supplement (PRO-STAT SUGAR FREE 64)  60 mL Per Tube BID  . free water  500 mL Per Tube Q8H  . furosemide  40 mg Intravenous Q6H  . insulin aspart  0-20 Units Subcutaneous Q4H  . insulin glargine  10 Units Subcutaneous QHS  . levETIRAcetam  500 mg Intravenous Q12H  . levofloxacin (LEVAQUIN) IV  750 mg Intravenous Q24H  . pantoprazole (PROTONIX) IV  40 mg Intravenous Daily  . piperacillin-tazobactam (ZOSYN)  IV  3.375 g Intravenous Q8H  .  potassium chloride  40 mEq Per Tube TID  . sodium chloride  10-40 mL Intracatheter Q12H  . sodium phosphate  Dextrose 5% IVPB  30 mmol Intravenous Once  . vancomycin  1,250 mg Intravenous Q12H    Continuous Infusions: . sodium chloride Stopped (12/18/13 0700)  . dexmedetomidine Stopped (12/17/13 0800)  . feeding supplement (OSMOLITE 1.2 CAL) 1,000 mL (12/16/13 1400)  . heparin 2,050 Units/hr (12/18/13 0654)    [place], [place], CNSC [phone] Pager [phone] After Hours Pager

## 2013-12-18 NOTE — Progress Notes (Signed)
PULMONARY / CRITICAL CARE MEDICINE  Name: Danny Suarez MRN: [id number] DOB: 09-27-1961    ADMISSION DATE:  12-18-2013 CONSULTATION DATE:  11/28/12  REFERRING MD :  Neurosurgery PRIMARY SERVICE: Neurosurgery   CHIEF COMPLAINT:  Resp failure  BRIEF PATIENT DESCRIPTION: 53 yo admitted s/p R craniotomy for GBM resection / biopsy on 1/14. Was in ICU initially post-op, and eventually was sent to step down. 2/02 transferred back to ICU with resp distress, AMS and fever. Intubated  SIGNIFICANT EVENTS / STUDIES:  1/13   Admit for planned craniotomy after abnormal MRI / CT 1/14   Craniotomy / tumor resection / bx per Dr. Kathyrn Sheriff 1/31   PEG placement 2/2     Acute respiratory failure - transferred to ICU, intubated 2/2     CT head - evolving inferior right frontal lobe infarct.  LINES / TUBES: ETT 1/15 >>> 1/17 RIJ CVL 1/14 >>  PEG 1/31 >>  ETT 2/02 >>   CULTURES: Urine 2/02 >>  resp 2/02 >>  Blood 2/02 >>   ANTIBIOTICS: Ancef 1/14 >>>1/15 Zosyn 2/2 >>  Vancomycin 2/2 >>  Levaquin 2/2 >>   SUBJECTIVE:  Ventric fell out overnight  VITAL SIGNS: Temp:  [98.6 F (37 C)-101 F (38.3 C)] 98.6 F (37 C) (02/04 0804) Pulse Rate:  [56-103] 88 (02/04 1000) Resp:  [22-31] 27 (02/04 1000) BP: (106-135)/(68-91) 128/83 mmHg (02/04 1000) SpO2:  [98 %-100 %] 99 % (02/04 1000) FiO2 (%):  [40 %] 40 % (02/04 0800) Weight:  [126 kg (277 lb 12.5 oz)] 126 kg (277 lb 12.5 oz) (02/04 0435) Vent Mode:  [-] PSV;CPAP FiO2 (%):  [40 %] 40 % Set Rate:  [16 bmp] 16 bmp Vt Set:  [550 mL] 550 mL PEEP:  [5 cmH20] 5 cmH20 Pressure Support:  [5 cmH20-8 cmH20] 8 cmH20 Plateau Pressure:  [10 cmH20-12 cmH20] 12 cmH20  INTAKE / OUTPUT: Intake/Output     02/03 0701 - 02/04 0700 02/04 0701 - 02/05 0700   I.V. (mL/kg) 2964.1 (23.5) 61.5 (0.5)   Other     NG/GT 1000    IV Piggyback 1103 204   Total Intake(mL/kg) 5067.1 (40.2) 265.5 (2.1)   Urine (mL/kg/hr) 1550 (0.5) 180 (0.4)   Drains     Total  Output 1550 180   Net +3517.1 +85.5         PHYSICAL EXAMINATION: General:  unresponsive, synchronous with vent Neuro: Obtunded, Opens eyes to verbal, no verbal response. Does not follow commands. HEENT: Post craniotomy changes, incision c/d/i Cardiovascular:  Regular, no murmurs Lungs: bilateral rhonchi Abdomen:  Soft, non tender Musculoskeletal:  Minimal symmetric ankle edema Skin:  L knee surgical incision site, R crani surgical incision site. both c,d,i  CBC  Recent Labs Lab 12/16/13 1530 12/17/13 0535 12/18/13 0500  WBC 20.1* 18.2* 11.9*  HGB 13.1 12.7* 10.0*  HCT 40.2 38.6* 30.7*  PLT 231 159 155   Coag's No results found for this basename: APTT, INR,  in the last 168 hours  BMET  Recent Labs Lab 12/16/13 1530 12/17/13 0535 12/18/13 0500  NA 153* 153* 149*  K 4.6 4.3 3.3*  CL 110 113* 111  CO2 25 20 24   BUN 63* 62* 34*  CREATININE 1.76* 1.53* 1.07  GLUCOSE 233* 148* 171*   Electrolytes  Recent Labs Lab 12/16/13 1530 12/17/13 0535 12/18/13 0500  CALCIUM 8.9 8.4 8.1*  MG  --   --  2.2  PHOS  --   --  1.9*   Sepsis  Markers  Recent Labs Lab 12/16/13 1530 12/16/13 1635 12/17/13 0535 12/18/13 0500  LATICACIDVEN  --  2.7*  --   --   PROCALCITON 0.63  --  4.62 2.93   ABG  Recent Labs Lab 12/18/13 0335  PHART 7.389  PCO2ART 41.2  PO2ART 106.0*   Liver Enzymes No results found for this basename: AST, ALT, ALKPHOS, BILITOT, ALBUMIN,  in the last 168 hours Cardiac Enzymes No results found for this basename: TROPONINI, PROBNP,  in the last 168 hours Glucose  Recent Labs Lab 12/17/13 0741 12/17/13 1208 12/17/13 1613 12/17/13 1944 12/17/13 2322 12/18/13 0317  GLUCAP 120* 138* 162* 122* 149* 124*   CXR: Low lung volumes with perihilar atelectasis   ASSESSMENT / PLAN:  PULMONARY A: Acute Respiratory Failure - In setting of new LLL opacity, fever, favor infective etiology.  PNA (? Aspiration) vs PE (not on chemical ppx, ? dvt post  op knee surg in early jan.) OSA on home CPAP CTA with a PE P:   - Continue PS trials but no extubation given mental status. - Pulmonary Hygeine - CXR and ABG in AM. - Continue heparin for PE. - Patient is not a trach candidate given cancer prognosis.  NEUROLOGIC A:   Glioblastoma multiforme s/p craniotomy, evolving CVA Acute encephalopathy  P:   - Minimize sedation as able. - GBM per neurosurgery - Supportive Care. - ?reinsert ventric, will defer to NS.  CARDIOVASCULAR A:  SIRS/sepsis: likely pulm source, but cxr not impressive   P:  - Continue current care.  RENAL A:   No known issues   P:   - Hypernatremia on free water 500 q8. - BMET in AM. - Replace electrolytes as indicated. - Lasix 40 mg IV q6 x3 doses given positive 6.5 liters in the last 48 hours.  INFECTIOUS A:   Sepsis - potential sources include PNA (?aspiration) and Right IJ CVL Fever   P:   - ABX as above. - Cooling blanket. - Fever control. - F/u cultures.  GASTROINTESTINAL A:    Nutrition GI Px  P:   - TF per Nutritionist - Protonix  HEMATOLOGIC A:   Wafarin at home prior to surgery (DVT Px post knee replacement?) DVT Px  P:   - F/U CBC. - SCDs  ENDOCRINE A:  Hyperglycemia  P:   - CBG testing - SSI - Lantus  Today's Summary: Spoke with wife, informed here that the recommendations would be to make patient DNR and once extubated is no longer to be reintubated.  Communicated with NS as well and palliative care consult called for goals of care.  Family is not quite ready to make any decisions at this time but regardless patient is not a trach candidate given the fact that his cancer is terminal and treatment is palliative at this point.   CC time 35 min.  Rush Farmer, M.D. [facility] Pulmonary/Critical Care Medicine. Pager: [phone]. After hours pager: [phone].

## 2013-12-18 NOTE — Progress Notes (Signed)
ANTICOAGULATION CONSULT NOTE - Follow Up Consult  Pharmacy Consult for Heparin  Indication: pulmonary embolus  Allergies  Allergen Reactions  . Mobic [Meloxicam]   . Nuvigil [Armodafinil]   . Prednisolone    Patient Measurements: Height: 6\' 4"  (193 cm) Weight: 277 lb 12.5 oz (126 kg) IBW/kg (Calculated) : 86.8 Heparin Dosing Weight: 112kg  Vital Signs: Temp: 101 F (38.3 C) (02/04 0400) Temp src: Core (Comment) (02/04 0400) BP: 124/74 mmHg (02/04 0600) Pulse Rate: 88 (02/04 0600)  Labs:  Recent Labs  12/16/13 1530  12/17/13 0535 12/17/13 1300 12/17/13 2145 12/18/13 0500  HGB 13.1  --  12.7*  --   --  10.0*  HCT 40.2  --  38.6*  --   --  30.7*  PLT 231  --  159  --   --  155  HEPARINUNFRC  --   < > 0.14* 0.23* 0.31 0.28*  CREATININE 1.76*  --  1.53*  --   --  1.07  < > = values in this interval not displayed.  Estimated Creatinine Clearance: 118.4 ml/min (by C-G formula based on Cr of 1.07).  Medications:  Heparin 1950 units/hr  Assessment: 53 y/o M admitted 1/13 for R-craniotomy for GBM, found to have PE, started on heparin, aiming for lower end of therapeutic range (0.3-0.5), HL is 0.28, other labs as above.   Goal of Therapy:  Heparin level 0.3-0.5 units/ml Monitor platelets by anticoagulation protocol: Yes   Plan:  -Increase heparin drip slightly to 2050 units/hr -1300 HL -Daily CBC/HL -Monitor for bleeding  Narda Bonds 12/18/2013,6:09 AM

## 2013-12-19 ENCOUNTER — Inpatient Hospital Stay (HOSPITAL_COMMUNITY): Payer: BC Managed Care – PPO

## 2013-12-19 DIAGNOSIS — R4020 Unspecified coma: Secondary | ICD-10-CM

## 2013-12-19 LAB — CULTURE, RESPIRATORY W GRAM STAIN: Gram Stain: NONE SEEN

## 2013-12-19 LAB — BASIC METABOLIC PANEL
BUN: 40 mg/dL — ABNORMAL HIGH (ref 6–23)
CHLORIDE: 106 meq/L (ref 96–112)
CO2: 25 meq/L (ref 19–32)
Calcium: 7.9 mg/dL — ABNORMAL LOW (ref 8.4–10.5)
Creatinine, Ser: 1.42 mg/dL — ABNORMAL HIGH (ref 0.50–1.35)
GFR calc non Af Amer: 56 mL/min — ABNORMAL LOW (ref >90)
GFR, EST AFRICAN AMERICAN: 65 mL/min — AB (ref >90)
Glucose, Bld: 192 mg/dL — ABNORMAL HIGH (ref 70–99)
POTASSIUM: 3 meq/L — AB (ref 3.7–5.3)
SODIUM: 148 meq/L — AB (ref 137–147)

## 2013-12-19 LAB — BLOOD GAS, ARTERIAL
Acid-Base Excess: 1.9 mmol/L (ref 0.0–2.0)
BICARBONATE: 24.7 meq/L — AB (ref 20.0–24.0)
Drawn by: 34764
FIO2: 0.4 %
MECHVT: 550 mL
O2 SAT: 99.6 %
PEEP: 5 cmH2O
Patient temperature: 101.3
RATE: 16 resp/min
TCO2: 25.7 mmol/L (ref 0–100)
pCO2 arterial: 33.4 mmHg — ABNORMAL LOW (ref 35.0–45.0)
pH, Arterial: 7.489 — ABNORMAL HIGH (ref 7.350–7.450)
pO2, Arterial: 141 mmHg — ABNORMAL HIGH (ref 80.0–100.0)

## 2013-12-19 LAB — PHOSPHORUS: PHOSPHORUS: 2.9 mg/dL (ref 2.3–4.6)

## 2013-12-19 LAB — GLUCOSE, CAPILLARY
GLUCOSE-CAPILLARY: 113 mg/dL — AB (ref 70–99)
GLUCOSE-CAPILLARY: 141 mg/dL — AB (ref 70–99)
Glucose-Capillary: 136 mg/dL — ABNORMAL HIGH (ref 70–99)
Glucose-Capillary: 155 mg/dL — ABNORMAL HIGH (ref 70–99)
Glucose-Capillary: 155 mg/dL — ABNORMAL HIGH (ref 70–99)
Glucose-Capillary: 158 mg/dL — ABNORMAL HIGH (ref 70–99)

## 2013-12-19 LAB — CBC
HCT: 30.2 % — ABNORMAL LOW (ref 39.0–52.0)
Hemoglobin: 9.7 g/dL — ABNORMAL LOW (ref 13.0–17.0)
MCH: 27.5 pg (ref 26.0–34.0)
MCHC: 32.1 g/dL (ref 30.0–36.0)
MCV: 85.6 fL (ref 78.0–100.0)
PLATELETS: 175 10*3/uL (ref 150–400)
RBC: 3.53 MIL/uL — AB (ref 4.22–5.81)
RDW: 14.5 % (ref 11.5–15.5)
WBC: 7.2 10*3/uL (ref 4.0–10.5)

## 2013-12-19 LAB — CULTURE, RESPIRATORY

## 2013-12-19 LAB — HEPARIN LEVEL (UNFRACTIONATED): HEPARIN UNFRACTIONATED: 0.4 [IU]/mL (ref 0.30–0.70)

## 2013-12-19 LAB — MAGNESIUM: MAGNESIUM: 1.9 mg/dL (ref 1.5–2.5)

## 2013-12-19 MED ORDER — MAGNESIUM SULFATE 40 MG/ML IJ SOLN
2.0000 g | Freq: Once | INTRAMUSCULAR | Status: AC
  Administered 2013-12-19: 2 g via INTRAVENOUS
  Filled 2013-12-19 (×2): qty 50

## 2013-12-19 MED ORDER — POTASSIUM CHLORIDE 20 MEQ/15ML (10%) PO LIQD
40.0000 meq | ORAL | Status: AC
  Administered 2013-12-19 (×2): 40 meq
  Filled 2013-12-19 (×2): qty 30

## 2013-12-19 MED ORDER — DEXTROSE 5 % IV SOLN
1.0000 g | INTRAVENOUS | Status: DC
  Administered 2013-12-19 – 2013-12-22 (×4): 1 g via INTRAVENOUS
  Filled 2013-12-19 (×4): qty 10

## 2013-12-19 MED ORDER — MAGNESIUM SULFATE 50 % IJ SOLN
2.0000 g | Freq: Once | INTRAVENOUS | Status: DC
  Filled 2013-12-19: qty 4

## 2013-12-19 NOTE — Progress Notes (Signed)
UR completed.  Alvah Lagrow, RN BSN [facility] CCM Trauma/Neuro ICU Case Manager [phone]  

## 2013-12-19 NOTE — Progress Notes (Signed)
PULMONARY / CRITICAL CARE MEDICINE  Name: Danny Suarez MRN: [id number] DOB: 1961-06-17    ADMISSION DATE:  12/06/2013 CONSULTATION DATE:  11/28/12  REFERRING MD :  Neurosurgery PRIMARY SERVICE: Neurosurgery   CHIEF COMPLAINT:  Resp failure  BRIEF PATIENT DESCRIPTION: 53 yo admitted s/p R craniotomy for GBM resection / biopsy on 1/14. Was in ICU initially post-op, and eventually was sent to step down. 2/02 transferred back to ICU with resp distress, AMS and fever. Intubated  SIGNIFICANT EVENTS / STUDIES:  1/13   Admit for planned craniotomy after abnormal MRI / CT 1/14   Craniotomy / tumor resection / bx per Dr. Kathyrn Sheriff 1/31   PEG placement 2/2     Acute respiratory failure - transferred to ICU, intubated 2/2     CT head - evolving inferior right frontal lobe infarct.  LINES / TUBES: ETT 1/15 >>> 1/17 RIJ CVL 1/14 >>  PEG 1/31 >>  ETT 2/02 >>   CULTURES: Urine 2/02 >>  resp 2/02 >> mod strep resp 2/2>>>serratia Blood 2/02 >>   ANTIBIOTICS: Ancef 1/14 >>>1/15 Zosyn 2/2 >> 2/5 Vancomycin 2/2 >>2/5 Levaquin 2/2 >> 2/5 Ceftriaxone 2/5>>>  SUBJECTIVE:  Tolerating heparin  VITAL SIGNS: Temp:  [99.1 F (37.3 C)-101.6 F (38.7 C)] 99.1 F (37.3 C) (02/05 0800) Pulse Rate:  [31-103] 90 (02/05 0800) Resp:  [23-34] 25 (02/05 0800) BP: (114-140)/(71-94) 121/94 mmHg (02/05 0800) SpO2:  [98 %-100 %] 100 % (02/05 0800) FiO2 (%):  [40 %] 40 % (02/05 0800) Weight:  [126 kg (277 lb 12.5 oz)] 126 kg (277 lb 12.5 oz) (02/05 0500) Vent Mode:  [-] PSV;CPAP FiO2 (%):  [40 %] 40 % Set Rate:  [16 bmp] 16 bmp Vt Set:  [550 mL] 550 mL PEEP:  [5 cmH20] 5 cmH20 Pressure Support:  [5 cmH20-8 cmH20] 5 cmH20  INTAKE / OUTPUT: Intake/Output     02/04 0701 - 02/05 0700 02/05 0701 - 02/06 0700   I.V. (mL/kg) 872 (6.9) 40.5 (0.3)   Other 80    NG/GT 2467.5 60   IV Piggyback 1232    Total Intake(mL/kg) 4651.5 (36.9) 100.5 (0.8)   Urine (mL/kg/hr) 6185 (2) 210 (0.9)   Total Output  6185 210   Net -1533.5 -109.5        Stool Occurrence 1 x     PHYSICAL EXAMINATION: General:  Arousable, rass -2 Neuro: arousable intermittent follows commands HEENT: Post craniotomy changes, incision c/d/i Cardiovascular:  Regular, no murmurs Lungs: bilateral rhonchi unchanged Abdomen:  Soft, non tender Musculoskeletal:   Plus 1 edema Skin:  L knee surgical incision site, R crani surgical incision site. both c,d,i  CBC  Recent Labs Lab 12/17/13 0535 12/18/13 0500 12/19/13 0520  WBC 18.2* 11.9* 7.2  HGB 12.7* 10.0* 9.7*  HCT 38.6* 30.7* 30.2*  PLT 159 155 175   Coag's No results found for this basename: APTT, INR,  in the last 168 hours  BMET  Recent Labs Lab 12/17/13 0535 12/18/13 0500 12/19/13 0520  NA 153* 149* 148*  K 4.3 3.3* 3.0*  CL 113* 111 106  CO2 20 24 25   BUN 62* 34* 40*  CREATININE 1.53* 1.07 1.42*  GLUCOSE 148* 171* 192*   Electrolytes  Recent Labs Lab 12/17/13 0535 12/18/13 0500 12/19/13 0520  CALCIUM 8.4 8.1* 7.9*  MG  --  2.2 1.9  PHOS  --  1.9* 2.9   Sepsis Markers  Recent Labs Lab 12/16/13 1530 12/16/13 1635 12/17/13 0535 12/18/13 0500  LATICACIDVEN  --  2.7*  --   --   PROCALCITON 0.63  --  4.62 2.93   ABG  Recent Labs Lab 12/18/13 0335 12/19/13 0400  PHART 7.389 7.489*  PCO2ART 41.2 33.4*  PO2ART 106.0* 141.0*   Liver Enzymes No results found for this basename: AST, ALT, ALKPHOS, BILITOT, ALBUMIN,  in the last 168 hours Cardiac Enzymes No results found for this basename: TROPONINI, PROBNP,  in the last 168 hours Glucose  Recent Labs Lab 12/18/13 1154 12/18/13 1549 12/18/13 2001 12/18/13 2324 12/19/13 0314 12/19/13 0752  GLUCAP 123* 141* 138* 157* 136* 155*   CXR: hazy LLL, ett wnl, atx   ASSESSMENT / PLAN:  PULMONARY A: Acute Respiratory Failure  PNA (? Aspiration), lll? PE (not on chemical ppx, ? dvt post op knee surg in early jan.) OSA on home CPAP CTA with a PE P:   - PS 5, cpap5 ,  increase PS to 10 if needed -need to inform family of DNI when plan extubation - Pulmonary Hygeine - CXR for LLL changes - Continue heparin for PE. - Patient is not a trach candidate given cancer prognosis and would not improve quality -neg balance noted, maintain -abg reviewed, may need MV reduction  NEUROLOGIC A:   Glioblastoma multiforme s/p craniotomy, evolving CVA Acute encephalopathy  P:   - Minimize sedation as able. - GBM per neurosurgery - Supportive Care. -discussed likely prognosis with NS, will have goals of successful extubation, no trach , will inform wife  CARDIOVASCULAR A:  SIRS/sepsis: resolved  P:  - Continue current care -lasix  RENAL A:   Hypok, hypomag Error chem 2/3>>?  P:   - Hypernatremia but would tolerate to some extent Na 154, although surgery was weeks agho, likley can  Normalize Na - BMET in AM. - Replace electrolytes, k, mag - Lasix 40 mg IV to finishes doses  INFECTIOUS A:   Sepsis - LLL pnA? Fever   P:   -control temps -narrow to ceftraixone.  GASTROINTESTINAL A:    Nutrition GI Px  P:   - TF per Nutritionist - Protonix -dc colace  HEMATOLOGIC A:   Wafarin at home prior to surgery (DVT Px post knee replacement?) DVT Px PE P:   - F/U CBC. - SCDs can re add 2 weeks post DVt -heparin  ENDOCRINE A:  Hyperglycemia  P:   - CBG testing - SSI - Lantus  Today's Summary:  Consider continued support and when.if we find window of opportunity , to extubate then dni, weaning, narrow abx  CC time 35 min.  Lavon Paganini. Titus Mould, MD, [place] Pgr: [facility]

## 2013-12-19 NOTE — Progress Notes (Signed)
Thank you for consulting the Palliative Medicine Team at [facility] to meet your patient's and family's needs.   The reason that you asked [place] to see your patient is  For  Clarification of GOC and options  We have scheduled your patient for a meeting:   Friday 12-20-13 at 200pm  The Surrogate decision make is: Nuala Alpha (wife)   Your patient is able/unable to participate: unable  Wadie Lessen NP  Palliative Medicine Team Team Phone # [phone] Pager [phone]

## 2013-12-19 NOTE — Clinical Social Work Note (Signed)
Clinical Social Worker continuing to follow patient and family for support and discharge planning needs.  Patient is from [name] spoke with facility over the phone to provide updates.  Palliative Care consult has been ordered.  CSW to follow up with patient family following Palliative meeting to further explore appropriate discharge needs.  CSW remains available for support and to facilitate patient discharge needs once medically stable.  Danny Suarez, [place]

## 2013-12-19 NOTE — Progress Notes (Signed)
ANTICOAGULATION CONSULT NOTE - Follow Up Consult  Pharmacy Consult for Heparin  Indication: pulmonary embolus  Allergies  Allergen Reactions  . Mobic [Meloxicam]   . Nuvigil [Armodafinil]   . Prednisolone     Labs:  Recent Labs  12/17/13 0535  12/17/13 2145 12/18/13 0500 12/19/13 0520  HGB 12.7*  --   --  10.0* 9.7*  HCT 38.6*  --   --  30.7* 30.2*  PLT 159  --   --  155 175  HEPARINUNFRC 0.14*  < > 0.31 0.28* 0.40  CREATININE 1.53*  --   --  1.07 1.42*  < > = values in this interval not displayed.  Estimated Creatinine Clearance: 89.2 ml/min (by C-G formula based on Cr of 1.42).   Assessment: 53 y/o M admitted 1/13 for R-craniotomy for GBM, found to have PE, started on heparin, aiming for lower end of therapeutic range (0.3-0.5)  Heparin level = 0.4 this AM  Goal of Therapy:  Heparin level 0.3-0.5 units/ml Monitor platelets by anticoagulation protocol: Yes   Plan:  -Continue heparin drip at 2050 units / hr -Follow up AM labs  Thank you. Anette Guarneri, PharmD [phone] 12/19/2013,11:42 AM

## 2013-12-19 NOTE — Progress Notes (Signed)
Pt seen and examined. No issues overnight. Pt able to tolerate SBT for several hours yesterday.  EXAM: Temp:  [99.1 F (37.3 C)-101.6 F (38.7 C)] 99.1 F (37.3 C) (02/05 0800) Pulse Rate:  [31-103] 90 (02/05 0800) Resp:  [23-34] 25 (02/05 0800) BP: (114-140)/(71-94) 121/94 mmHg (02/05 0800) SpO2:  [98 %-100 %] 100 % (02/05 0800) FiO2 (%):  [40 %] 40 % (02/05 0800) Weight:  [126 kg (277 lb 12.5 oz)] 126 kg (277 lb 12.5 oz) (02/05 0500) Intake/Output     02/04 0701 - 02/05 0700 02/05 0701 - 02/06 0700   I.V. (mL/kg) 872 (6.9) 40.5 (0.3)   Other 80    NG/GT 2467.5 60   IV Piggyback 1232    Total Intake(mL/kg) 4651.5 (36.9) 100.5 (0.8)   Urine (mL/kg/hr) 6185 (2) 210 (0.8)   Total Output 6185 210   Net -1533.5 -109.5        Stool Occurrence 1 x     Off sedation, opens eyes to voice, tracks Intubated, breathing over vent Follows commands BUE, R more brisk than L. FC RLE.  Wound c/d/i  LABS: Lab Results  Component Value Date   CREATININE 1.42* 12/19/2013   BUN 40* 12/19/2013   NA 148* 12/19/2013   K 3.0* 12/19/2013   CL 106 12/19/2013   CO2 25 12/19/2013   Lab Results  Component Value Date   WBC 7.2 12/19/2013   HGB 9.7* 12/19/2013   HCT 30.2* 12/19/2013   MCV 85.6 12/19/2013   PLT 175 12/19/2013    IMPRESSION: - 52 y.o. male s/p crani for GBM with PE and likely pneumonia. Neurologically stable  PLAN: - Cont vent mgmt per PCCM. Wean to extubate once code status/long-term plan established - Cont heparin gtt for PE - Free water by PEG

## 2013-12-20 ENCOUNTER — Inpatient Hospital Stay (HOSPITAL_COMMUNITY): Payer: BC Managed Care – PPO

## 2013-12-20 DIAGNOSIS — Z515 Encounter for palliative care: Secondary | ICD-10-CM

## 2013-12-20 DIAGNOSIS — R5381 Other malaise: Secondary | ICD-10-CM

## 2013-12-20 DIAGNOSIS — R531 Weakness: Secondary | ICD-10-CM

## 2013-12-20 DIAGNOSIS — I2699 Other pulmonary embolism without acute cor pulmonale: Secondary | ICD-10-CM

## 2013-12-20 DIAGNOSIS — R5383 Other fatigue: Secondary | ICD-10-CM

## 2013-12-20 LAB — COMPREHENSIVE METABOLIC PANEL
ALBUMIN: 2.1 g/dL — AB (ref 3.5–5.2)
ALT: 140 U/L — ABNORMAL HIGH (ref 0–53)
AST: 99 U/L — AB (ref 0–37)
Alkaline Phosphatase: 152 U/L — ABNORMAL HIGH (ref 39–117)
BUN: 37 mg/dL — ABNORMAL HIGH (ref 6–23)
CHLORIDE: 112 meq/L (ref 96–112)
CO2: 25 mEq/L (ref 19–32)
Calcium: 8.5 mg/dL (ref 8.4–10.5)
Creatinine, Ser: 1.14 mg/dL (ref 0.50–1.35)
GFR calc Af Amer: 84 mL/min — ABNORMAL LOW (ref >90)
GFR calc non Af Amer: 73 mL/min — ABNORMAL LOW (ref >90)
Glucose, Bld: 185 mg/dL — ABNORMAL HIGH (ref 70–99)
Potassium: 3.8 mEq/L (ref 3.7–5.3)
Sodium: 150 mEq/L — ABNORMAL HIGH (ref 137–147)
Total Bilirubin: 0.3 mg/dL (ref 0.3–1.2)
Total Protein: 6.9 g/dL (ref 6.0–8.3)

## 2013-12-20 LAB — GLUCOSE, CAPILLARY
GLUCOSE-CAPILLARY: 172 mg/dL — AB (ref 70–99)
GLUCOSE-CAPILLARY: 135 mg/dL — AB (ref 70–99)
GLUCOSE-CAPILLARY: 131 mg/dL — AB (ref 70–99)
Glucose-Capillary: 150 mg/dL — ABNORMAL HIGH (ref 70–99)
Glucose-Capillary: 129 mg/dL — ABNORMAL HIGH (ref 70–99)
Glucose-Capillary: 158 mg/dL — ABNORMAL HIGH (ref 70–99)

## 2013-12-20 LAB — CBC WITH DIFFERENTIAL/PLATELET
Basophils Absolute: 0 10*3/uL (ref 0.0–0.1)
Basophils Relative: 0 % (ref 0–1)
EOS ABS: 0.2 10*3/uL (ref 0.0–0.7)
Eosinophils Relative: 4 % (ref 0–5)
HEMATOCRIT: 31.2 % — AB (ref 39.0–52.0)
HEMOGLOBIN: 10 g/dL — AB (ref 13.0–17.0)
LYMPHS ABS: 1 10*3/uL (ref 0.7–4.0)
Lymphocytes Relative: 18 % (ref 12–46)
MCH: 27.9 pg (ref 26.0–34.0)
MCHC: 32.1 g/dL (ref 30.0–36.0)
MCV: 86.9 fL (ref 78.0–100.0)
MONO ABS: 0.2 10*3/uL (ref 0.1–1.0)
MONOS PCT: 4 % (ref 3–12)
Neutro Abs: 3.8 10*3/uL (ref 1.7–7.7)
Neutrophils Relative %: 74 % (ref 43–77)
PLATELETS: 193 10*3/uL (ref 150–400)
RBC: 3.59 MIL/uL — ABNORMAL LOW (ref 4.22–5.81)
RDW: 14.8 % (ref 11.5–15.5)
WBC: 5.2 10*3/uL (ref 4.0–10.5)

## 2013-12-20 LAB — HEPARIN LEVEL (UNFRACTIONATED): HEPARIN UNFRACTIONATED: 0.34 [IU]/mL (ref 0.30–0.70)

## 2013-12-20 MED ORDER — FREE WATER
750.0000 mL | Freq: Three times a day (TID) | Status: DC
  Administered 2013-12-20 – 2013-12-24 (×12): 750 mL

## 2013-12-20 NOTE — Progress Notes (Signed)
Pt seen and examined. No issues overnight.   EXAM: Temp:  [98.8 F (37.1 C)-102.1 F (38.9 C)] 99.8 F (37.7 C) (02/06 1200) Pulse Rate:  [86-101] 89 (02/06 1305) Resp:  [12-34] 33 (02/06 1305) BP: (106-131)/(52-88) 131/65 mmHg (02/06 1305) SpO2:  [98 %-100 %] 98 % (02/06 1305) FiO2 (%):  [40 %] 40 % (02/06 1305) Weight:  [125.4 kg (276 lb 7.3 oz)] 125.4 kg (276 lb 7.3 oz) (02/06 0500) Intake/Output     02/05 0701 - 02/06 0700 02/06 0701 - 02/07 0700   I.V. (mL/kg) 972 (7.8) 543.7 (4.3)   Other 500 60   NG/GT 1440 360   IV Piggyback 310 155   Total Intake(mL/kg) 3222 (25.7) 1118.7 (8.9)   Urine (mL/kg/hr) 2585 (0.9) 560 (0.6)   Total Output 2585 560   Net +637 +558.7        Stool Occurrence 3 x 2 x    opens eyes to pain Intubated, breathing over vent Follows commands BUE, R more brisk than L. FC RLE.  Wound c/d/i  LABS: Lab Results  Component Value Date   CREATININE 1.14 12/20/2013   BUN 37* 12/20/2013   NA 150* 12/20/2013   K 3.8 12/20/2013   CL 112 12/20/2013   CO2 25 12/20/2013   Lab Results  Component Value Date   WBC 5.2 12/20/2013   HGB 10.0* 12/20/2013   HCT 31.2* 12/20/2013   MCV 86.9 12/20/2013   PLT 193 12/20/2013    IMPRESSION: - 53 y.o. male s/p crani for GBM with PE and likely pneumonia. Neurologically stable, maybe slightly more lethargic today.  PLAN: - Cont vent mgmt per PCCM. Wean to extubate once code status/long-term plan established. Meeting with family and palliative care service planned this pm. - Cont heparin gtt for PE - Free water by PEG - increase to 750 Q8hrs

## 2013-12-20 NOTE — Consult Note (Signed)
Patient ZH:[id number] Odowd      DOB: [birth year]      AST:[id number]     Consult Note from the Palliative Medicine Team at [place] [facility] Requested by: Dr Lake Bells     PCP: Iverson Alamin, MD Reason for Consultation: Clarification of [place] and options     Phone Number:None  Assessment of patients Current state: Danny Suarez is a 53 y.o. male with history of OSA, recent TKR, who has had cognitive deficits as well as malaise for few weeks prior to surgery. 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 Kathyrn Sheriff. Post op with somnolence as well as bradycardia. He was started on steroids as well as BIPAP and has has slow improvement in mentation. Generalized weakness. 2/02 transferred back to ICU with resp distress, AMS and fever. Intubated  Aggressive  ICU care.  Discussion with Dr Lake Bells this morning, he tells me  that patient has been weaning well from ventilator and he fells today is the optimal time for liberation from  the ventilator, any consideration for reintubation or tracheostomy not recommended   Palliative Medicine to meet with the family to discuss [place] and anticipatory care needs and to offer emotional support.   Goals of Care:   1.  Scope of Treatment:  Wife of patient has made decision for liberation from ventilator tomorrow at 1200 noon, if CCM feel that this is this the optimal time to give him the best opportunity for a successful wean. She wants family to have time to visit in the morning "just in case" the patient does not do well once extubated. She understands this is a possibility and had made Decision for no re-intubation at that time.  PMT will continue to support holistically   However she is open to all other available and offered medical interventions to prolong life at this time.  She is hopeful for improvement.  2. Psychosocial:   Emotional support offered to family.  This is very difficult for his wife.  She has been totally dependant on her husband for all needs (according to her brother), has not had to make decisions for their family in the past and these complex decisions are difficult to say the least  3. Spiritual:  [employer] support, pastor present for [employer].     Patient Documents Completed or Given: Document Given Completed  Advanced Directives Pkt    MOST yes   DNR    Gone from My Sight    Hard Choices yes     Brief HPI: Danny Suarez is a 53 y.o. male with history of OSA, recent TKR, who has had cognitive deficits as well as malaise for few weeks prior to surgery. 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 Kathyrn Sheriff. Post op with somnolence as well as bradycardia. He was started on steroids as well as BIPAP and has has slow improvement in mentation.  Medical situation complicated by 9/89 transferred back to ICU with resp distress, AMS and fever. Intubated    QJJ:HERDEY to illicit due to intubation and unresposniveness    PMH:  Past Medical History  Diagnosis Date  . Sleep apnea      PSH: Past Surgical History  Procedure Laterality Date  . Left knee replacement  11/2013  . Craniotomy  11/20/2013  Tumor resection   . Craniotomy Right 12/02/2013    Procedure: RIGHT CRANIOTOMY FOR RESECTION OF TUMOR;  Surgeon: Consuella Lose, MD;  Location: [facility];  Service: Neurosurgery;  Laterality: Right;  RIGHT CRANIOTOMY FOR RESECTION OF TUMOR  . Esophagogastroduodenoscopy N/A 12/03/2013    Procedure: ESOPHAGOGASTRODUODENOSCOPY (EGD);  Surgeon: Gwenyth Ober, MD;  Location: [facility];  Service: General;  Laterality: N/A;  tech only  . Peg placement N/A 11/18/2013    Procedure: PERCUTANEOUS ENDOSCOPIC GASTROSTOMY (PEG) PLACEMENT;  Surgeon: Gwenyth Ober, MD;  Location: [facility];  Service: General;  Laterality: N/A;   I have reviewed the [place] and SH and  If appropriate update it with new information. Allergies  Allergen Reactions  . Mobic [Meloxicam]   . Nuvigil [Armodafinil]   . Prednisolone    Scheduled Meds: . antiseptic oral rinse  15 mL Mouth Rinse q12n4p  . cefTRIAXone (ROCEPHIN)  IV  1 g Intravenous Q24H  . chlorhexidine  15 mL Mouth Rinse BID  . feeding supplement (PRO-STAT SUGAR FREE 64)  30 mL Per Tube QID  . free water  750 mL Per Tube Q8H  . insulin aspart  0-20 Units Subcutaneous Q4H  . insulin glargine  10 Units Subcutaneous QHS  . levETIRAcetam  500 mg Intravenous Q12H  . pantoprazole (PROTONIX) IV  40 mg Intravenous Daily  . sodium chloride  10-40 mL Intracatheter Q12H   Continuous Infusions: . sodium chloride 75 mL/hr at 12/20/13 0732  . feeding supplement (VITAL HIGH PROTEIN) 1,000 mL (12/20/13 1300)  . heparin 2,050 Units/hr (12/20/13 0700)   PRN Meds:.acetaminophen (TYLENOL) oral liquid 160 mg/5 mL, bisacodyl, fentaNYL, sodium chloride    BP 131/65  Pulse 89  Temp(Src) 99.8 F (37.7 C) (Axillary)  Resp 33  Ht 6\' 4"  (1.93 m)  Wt 125.4 kg (276 lb 7.3 oz)  BMI 33.67 kg/m2  SpO2 98%   PPS:  prior to intubation 30 % at best   Intake/Output Summary (Last 24 hours) at 12/20/13 1539 Last data filed at 12/20/13 1300  Gross per 24 hour  Intake 3021.67 ml  Output   2335 ml  Net 686.67 ml    Physical Exam:  General: unresponsive to vigorous stimulation and verbal stimuli by family HEENT:  Scalp Surgical incision line clean dry closed, noted ET tube Chest:  Scattered coarse BS CVS:  RRR Abdomen:soft +BS Ext: BLE +1 edema Neuro:  Ventilated, unresponsive to vigorous stimuli prestnly  Labs: CBC    Component Value Date/Time   WBC 5.2 12/20/2013 0410   RBC 3.59* 12/20/2013 0410   HGB 10.0* 12/20/2013 0410   HCT 31.2* 12/20/2013 0410   PLT 193 12/20/2013 0410   MCV 86.9 12/20/2013 0410   MCH 27.9  12/20/2013 0410   MCHC 32.1 12/20/2013 0410   RDW 14.8 12/20/2013 0410   LYMPHSABS 1.0 12/20/2013 0410   MONOABS 0.2 12/20/2013 0410   EOSABS 0.2 12/20/2013 0410   BASOSABS 0.0 12/20/2013 0410    BMET    Component Value Date/Time   NA 150* 12/20/2013 0410   K 3.8 12/20/2013 0410   CL 112 12/20/2013 0410   CO2 25 12/20/2013 0410   GLUCOSE 185* 12/20/2013 0410   BUN 37* 12/20/2013 0410   CREATININE 1.14 12/20/2013 0410   CALCIUM 8.5 12/20/2013 0410   GFRNONAA 73* 12/20/2013 0410   GFRAA 84* 12/20/2013 0410    CMP     Component Value Date/Time   NA 150* 12/20/2013 0410   K 3.8  12/20/2013 0410   CL 112 12/20/2013 0410   CO2 25 12/20/2013 0410   GLUCOSE 185* 12/20/2013 0410   BUN 37* 12/20/2013 0410   CREATININE 1.14 12/20/2013 0410   CALCIUM 8.5 12/20/2013 0410   PROT 6.9 12/20/2013 0410   ALBUMIN 2.1* 12/20/2013 0410   AST 99* 12/20/2013 0410   ALT 140* 12/20/2013 0410   ALKPHOS 152* 12/20/2013 0410   BILITOT 0.3 12/20/2013 0410   GFRNONAA 73* 12/20/2013 0410   GFRAA 84* 12/20/2013 0410       Time In Time Out Total Time Spent with Patient Total Overall Time  1400 1610 120 min 130 min    Greater than 50%  of this time was spent counseling and coordinating care related to the above assessment and plan.  Wadie Lessen NP  Palliative Medicine Team Team Phone # [phone] Pager [phone]  Discussed with Dr Waunita Schooner and Dr Halford Chessman and  Dr Kathyrn Sheriff

## 2013-12-20 NOTE — Clinical Social Work Note (Signed)
Clinical Social Worker continuing to follow patient and family for support and discharge planning needs.  Patient family met with Palliative Care team at 14:00 to discuss patient goals of care.  Patient wife has agreed to extubation tomorrow (02/07) at 12:00 with no plans of re-intubation.  Per Palliative team, patient family hesitant regarding the idea of pursuing comfort care only at this time.  CSW will remain available for support and to assist with discharge needs as deemed appropriate.  Barbette Or, [place]

## 2013-12-20 NOTE — Progress Notes (Signed)
ANTICOAGULATION CONSULT NOTE - Follow Up Consult  Pharmacy Consult for Heparin  Indication: pulmonary embolus  Allergies  Allergen Reactions  . Mobic [Meloxicam]   . Nuvigil [Armodafinil]   . Prednisolone     Labs:  Recent Labs  12/18/13 0500 12/19/13 0520 12/20/13 0410  HGB 10.0* 9.7* 10.0*  HCT 30.7* 30.2* 31.2*  PLT 155 175 193  HEPARINUNFRC 0.28* 0.40 0.34  CREATININE 1.07 1.42* 1.14    Estimated Creatinine Clearance: 110.8 ml/min (by C-G formula based on Cr of 1.14).   Assessment: 53 y/o M admitted 1/13 for R-craniotomy for GBM, found to have PE, started on heparin, aiming for lower end of therapeutic range (0.3-0.5)  Heparin level = 0.34 this AM  Goal of Therapy:  Heparin level 0.3-0.5 units/ml Monitor platelets by anticoagulation protocol: Yes   Plan:  -Continue heparin drip at 2050 units / hr -Follow up AM labs  Thank you. Anette Guarneri, PharmD [phone] 12/20/2013,12:48 PM

## 2013-12-20 NOTE — Progress Notes (Signed)
PULMONARY / CRITICAL CARE MEDICINE  Name: Danny Suarez MRN: [id number] DOB: 1961/01/08    ADMISSION DATE:  12/03/2013 CONSULTATION DATE:  11/28/12  REFERRING MD :  Neurosurgery PRIMARY SERVICE: Neurosurgery   CHIEF COMPLAINT:  Resp failure  BRIEF PATIENT DESCRIPTION: 53 yo admitted s/p R craniotomy for GBM resection / biopsy on 1/14. Was in ICU initially post-op, and eventually was sent to step down. 2/02 transferred back to ICU with resp distress, AMS and fever. Intubated  SIGNIFICANT EVENTS / STUDIES:  1/13   Admit for planned craniotomy after abnormal MRI / CT 1/14   Craniotomy / tumor resection / bx per Dr. Kathyrn Sheriff 1/31   PEG placement 2/2     Acute respiratory failure - transferred to ICU, intubated 2/2     CT head - evolving inferior right frontal lobe infarct.  LINES / TUBES: ETT 1/15 >>> 1/17 RIJ CVL 1/14 >>  PEG 1/31 >>  ETT 2/02 >>   CULTURES: Urine 2/02 >>  resp 2/02 >> mod strep resp 2/2>>>serratia Blood 2/02 >>   ANTIBIOTICS: Ancef 1/14 >>>1/15 Zosyn 2/2 >> 2/5 Vancomycin 2/2 >>2/5 Levaquin 2/2 >> 2/5 Ceftriaxone 2/5>>>  SUBJECTIVE:  Followed commands overnight, no acute issues  VITAL SIGNS: Temp:  [98.8 F (37.1 C)-102.1 F (38.9 C)] 99.3 F (37.4 C) (02/06 0321) Pulse Rate:  [86-101] 91 (02/06 0700) Resp:  [18-33] 25 (02/06 0700) BP: (110-128)/(56-94) 118/56 mmHg (02/06 0700) SpO2:  [98 %-100 %] 99 % (02/06 0700) FiO2 (%):  [40 %] 40 % (02/06 0700) Weight:  [125.4 kg (276 lb 7.3 oz)] 125.4 kg (276 lb 7.3 oz) (02/06 0500) Vent Mode:  [-] PRVC FiO2 (%):  [40 %] 40 % Set Rate:  [16 bmp] 16 bmp Vt Set:  [550 mL] 550 mL PEEP:  [5 cmH20] 5 cmH20 Pressure Support:  [5 cmH20-8 cmH20] 8 cmH20 Plateau Pressure:  [15 cmH20-18 cmH20] 18 cmH20  INTAKE / OUTPUT: Intake/Output     02/05 0701 - 02/06 0700 02/06 0701 - 02/07 0700   I.V. (mL/kg) 972 (7.8)    Other 500    NG/GT 1440    IV Piggyback 310    Total Intake(mL/kg) 3222 (25.7)    Urine  (mL/kg/hr) 2585 (0.9)    Total Output 2585     Net +637          Stool Occurrence 3 x     PHYSICAL EXAMINATION: General:  Arouses to voice, not following commands for me HEENT: scalp laceration well healed, ETT PULM: Rronchi bilat CV: RRR, no mgr AB: BS+, soft, nontender Ext: pitting edema bilat Neuro: on vent, periodically follows commands, opens eyes  CBC  Recent Labs Lab 12/18/13 0500 12/19/13 0520 12/20/13 0410  WBC 11.9* 7.2 5.2  HGB 10.0* 9.7* 10.0*  HCT 30.7* 30.2* 31.2*  PLT 155 175 193   Coag's No results found for this basename: APTT, INR,  in the last 168 hours  BMET  Recent Labs Lab 12/18/13 0500 12/19/13 0520 12/20/13 0410  NA 149* 148* 150*  K 3.3* 3.0* 3.8  CL 111 106 112  CO2 24 25 25   BUN 34* 40* 37*  CREATININE 1.07 1.42* 1.14  GLUCOSE 171* 192* 185*   Electrolytes  Recent Labs Lab 12/18/13 0500 12/19/13 0520 12/20/13 0410  CALCIUM 8.1* 7.9* 8.5  MG 2.2 1.9  --   PHOS 1.9* 2.9  --    Sepsis Markers  Recent Labs Lab 12/16/13 1530 12/16/13 1635 12/17/13 0535 12/18/13 0500  LATICACIDVEN  --  2.7*  --   --   PROCALCITON 0.63  --  4.62 2.93   ABG  Recent Labs Lab 12/18/13 0335 12/19/13 0400  PHART 7.389 7.489*  PCO2ART 41.2 33.4*  PO2ART 106.0* 141.0*   Liver Enzymes  Recent Labs Lab 12/20/13 0410  AST 99*  ALT 140*  ALKPHOS 152*  BILITOT 0.3  ALBUMIN 2.1*   Cardiac Enzymes No results found for this basename: TROPONINI, PROBNP,  in the last 168 hours Glucose  Recent Labs Lab 12/19/13 0752 12/19/13 1220 12/19/13 1553 12/19/13 2003 12/19/13 2325 12/20/13 0319  GLUCAP 155* 113* 155* 141* 158* 172*   CXR: hazy LLL, ett wnl, atx   ASSESSMENT / PLAN:  PULMONARY A: Acute Respiratory Failure > vent mechanics OK for extubation PNA unlikely > infiltrate cleared PE  OSA on home CPAP CTA with a PE P:   - PS again today - extubation post goals of care conversation - Pulmonary Hygeine - CXR for LLL  changes - Continue heparin for PE. - Patient is not a trach candidate given cancer prognosis and would not improve quality   NEUROLOGIC A:   Glioblastoma multiforme s/p craniotomy, evolving CVA Acute encephalopathy > unlikely that he is going to do well overall, trach will not change quality of life P:   - Minimize sedation as able. - GBM per neurosurgery - Supportive Care.    CARDIOVASCULAR A:  SIRS/sepsis: resolved  P:  - Continue current care - goal volume status slightly positive  RENAL A:   Hyponatremia >  Hypok, hypomag Error chem 2/3>>?  P:   - gentle IVF for 12 hours today - goal slightly positive given Na - BMET in AM. - Replace electrolytes, k, mag  INFECTIOUS A:   Sepsis - aspiration pneumonitis?> resolved   P:   -control temps -narrow to ceftraixone through 2/8  GASTROINTESTINAL A:    Nutrition GI Px  P:   - TF per Nutritionist - Protonix -dc colace  HEMATOLOGIC A:   Wafarin at home prior to surgery  DVT / PE P:   - F/U CBC. -heparin gtt  ENDOCRINE A:  Hyperglycemia  P:   - CBG testing - SSI - Lantus  Today's Summary:  Need to establish goals of care with wife prior to extubation  CC time 35 min.  Jillyn Hidden PCCM Pager: [phone] Cell: [phone] If no response, call [phone]

## 2013-12-21 DIAGNOSIS — Z515 Encounter for palliative care: Secondary | ICD-10-CM

## 2013-12-21 LAB — GLUCOSE, CAPILLARY
Glucose-Capillary: 157 mg/dL — ABNORMAL HIGH (ref 70–99)
Glucose-Capillary: 123 mg/dL — ABNORMAL HIGH (ref 70–99)
Glucose-Capillary: 134 mg/dL — ABNORMAL HIGH (ref 70–99)
Glucose-Capillary: 110 mg/dL — ABNORMAL HIGH (ref 70–99)
Glucose-Capillary: 119 mg/dL — ABNORMAL HIGH (ref 70–99)

## 2013-12-21 LAB — CBC
HEMATOCRIT: 30.2 % — AB (ref 39.0–52.0)
HEMOGLOBIN: 9.5 g/dL — AB (ref 13.0–17.0)
MCH: 27.5 pg (ref 26.0–34.0)
MCHC: 31.5 g/dL (ref 30.0–36.0)
MCV: 87.3 fL (ref 78.0–100.0)
Platelets: 204 10*3/uL (ref 150–400)
RBC: 3.46 MIL/uL — AB (ref 4.22–5.81)
RDW: 15.2 % (ref 11.5–15.5)
WBC: 5.1 10*3/uL (ref 4.0–10.5)

## 2013-12-21 LAB — HEPARIN LEVEL (UNFRACTIONATED): Heparin Unfractionated: 0.34 IU/mL (ref 0.30–0.70)

## 2013-12-21 MED ORDER — ATROPINE SULFATE 1 % OP SOLN
4.0000 [drp] | OPHTHALMIC | Status: DC | PRN
  Administered 2013-12-24: 4 [drp] via SUBLINGUAL
  Filled 2013-12-21 (×2): qty 2

## 2013-12-21 MED ORDER — MORPHINE SULFATE 2 MG/ML IJ SOLN
1.0000 mg | INTRAMUSCULAR | Status: DC | PRN
  Administered 2013-12-23 – 2013-12-24 (×4): 2 mg via INTRAVENOUS
  Filled 2013-12-21 (×4): qty 1

## 2013-12-21 NOTE — Progress Notes (Signed)
Patient UM:Danny Suarez      DOB: 10/08/1961      ERX:[id number]   Palliative Medicine Team at [facility] Progress Note    Subjective: 53 yr old white male presented with vague memory issues sleeping found to have abnormal brain scan showing subsellar tumor.  Patient underwent steriotactic right fronto temporal craniotomy on 1/14. Pathology consistent with grad IV glioblastoma. Course was complicated by waxing and waning cognitive status.  Patient was extubated on 1/17 and treated for several days with cpap/bipap.Repeat scan post op showed new acute infarction on the right basal ganglia, right thalamus and inferior right frontal lobe. Pt has history of sleep apnea. He was able to maintain wakefulness for several days and was being considered for rehab. A peg was placed on 1/30. Patient developed fever and on 2/2 developed respiratory distress requiring intubation. CT on 2/2 showed evolving inferior right frontal lobe infarct.. Repeat scan post op showed new acute infarction on the right basal ganglia, right thalamus and inferior right frontal lobe.  Patient developed worsening ventriculomegaly and a left frontal ventriculostomy was done on 2/2. Patient subsequently was diagnosed with a saddlePE and heprin was initated as the risks outweighed the benefits. Right and left Dvt confirmed. Danny Suarez is not a trach candidate and so the primary services and palliative medicine team met with his wife to discuss options.  I have met personally with 2022-12-20 and her daughter Danny Suarez Danny Suarez has down syndrome, age 78 does understand some things about death after the loss of her grandfather and so we have talked with her about the fact that her dad might go to visit her granddad and another friend) overall I prepared 2022/12/20 for Danny Suarez's ultimate death as even if he does survive extubation he will likely develop respiratory distress again.  12-20-2022 is agreeable to do not resuscitate and no reintubation if he is unable to hold his own.  We  talked about comfort measures if and when he develops distress.  At this time, we did not focus on withdraw of full treatments as 12-20-2022 is so overwhelmed with just the respiratory issue.  Once extubated we can move to full comfort with 2022-12-20 as we see how he does.  She does not want him to suffer unnecessarily but does want to give him the opportunity to be liberated from the vent.  I have reviewed this with the pulmonary doctors.  We will attempt a therapeutic extubation without intent to reintubate.  If he develops distress will convert to comfort measures and assist family with bereavement.   Filed Vitals:   12/21/13 1100  BP: 149/78  Pulse: 92  Temp:   Resp: 36   Physical exam:  Genreral: will intermittently open left eye, right eye swollen Can't fully assess pupil, but left appeared round Chest decreased without wheezing or rhonci CVS: regular , S1, S2 ABd: obese, soft, no grimace Ext: edema all limbs Neuro: will open eyes to loud verbal stimuli Lab Results  Component Value Date   CREATININE 1.14 12/20/2013   BUN 37* 12/20/2013   NA 150* 12/20/2013   K 3.8 12/20/2013   CL 112 12/20/2013   CO2 25 12/20/2013   Lab Results  Component Value Date   WBC 5.1 12/21/2013   HGB 9.5* 12/21/2013   HCT 30.2* 12/21/2013   MCV 87.3 12/21/2013   PLT 204 12/21/2013    Assessment and plan: 53 yr old white male with Stage IV glioblastoma, course complicated by stroke, saddle embolus, hydrocephalus and continued altered  mentation.  Family ok with extubation for therapeutic purposes while continuing current treatments.  Plan is not to reintubate or trach.  DnR initiated.  Prepared his wife as best I could for ultimate death of patient and started working his disabled daughter through the grief process.    1.  Change status to DNR.  NO reintubation but continue current curative treatments for now.  2.  OSA: per pulmonary, don't know if their is a roll for CPAP?  3.  Will continue to work with family on transition  goals.  If patient develops respiratory distress they are prepared to initiate continuous opiates and other comfort measures.   Total time:  1200-230pm    Catrice Zuleta L. Lovena Le, MD MBA The Palliative Medicine Team at [facility] Phone: [phone] Pager: [phone]

## 2013-12-21 NOTE — Progress Notes (Signed)
Chaplain encountered family in [facility] waiting area and offered support. Pt's wife said "we are waiting for him die." She explained that pt had been extubated. RN said that pt may continue to live for several days as his breathing is not labored. Chaplain provided emotional and spiritual support, a caring presence, and empathic listening to the family.  They appreciated support.   Ethelene Browns  [phone]

## 2013-12-21 NOTE — Progress Notes (Signed)
Subjective: Patient reports Unresponsive. Minimal ventilatory support.  Objective: Vital signs in last 24 hours: Temp:  [98 F (36.7 C)-101.3 F (38.5 C)] 98.4 F (36.9 C) (02/07 0742) Pulse Rate:  [70-114] 93 (02/07 1000) Resp:  [12-34] 30 (02/07 1000) BP: (118-151)/(58-88) 151/88 mmHg (02/07 1000) SpO2:  [97 %-100 %] 99 % (02/07 1000) FiO2 (%):  [40 %] 40 % (02/07 1000) Weight:  [124.7 kg (274 lb 14.6 oz)] 124.7 kg (274 lb 14.6 oz) (02/07 0500)  Intake/Output from previous day: 02/06 0701 - 02/07 0700 In: 4937.7 [I.V.:1437.7; NG/GT:2940; IV Piggyback:260] Out: 2320 [Urine:2320] Intake/Output this shift: Total I/O In: 291.5 [I.V.:61.5; NG/GT:180; IV Piggyback:50] Out: 325 [Urine:325]  No grimace to pain. Weak movement of upper extremities to deep pain. Patient breathes well with minimal ventilatory support.  Lab Results:  Recent Labs  12/20/13 0410 12/21/13 0409  WBC 5.2 5.1  HGB 10.0* 9.5*  HCT 31.2* 30.2*  PLT 193 204   BMET  Recent Labs  12/19/13 0520 12/20/13 0410  NA 148* 150*  K 3.0* 3.8  CL 106 112  CO2 25 25  GLUCOSE 192* 185*  BUN 40* 37*  CREATININE 1.42* 1.14  CALCIUM 7.9* 8.5    Studies/Results: Dg Chest Port 1 View  12/20/2013   CLINICAL DATA:  Intubation.  EXAM: PORTABLE CHEST - 1 VIEW  COMPARISON:  DG CHEST 1V PORT dated 12/19/2013  FINDINGS: Endotracheal tube and central line in stable position. Basilar atelectasis. No pleural effusion or pneumothorax. Heart size normal .  IMPRESSION: 1. Stable line and tube positions. 2. Basilar atelectasis.   Electronically Signed   By: Marcello Moores  Register   On: 12/20/2013 07:27    Assessment/Plan: Third ventricular malignant brain tumor. Prognosis extremely poor.  LOS: 25 days  Extubation today at noon as planned. Patient is not to be reintubated afterwards.   Caterina Racine J 12/21/2013, 11:18 AM

## 2013-12-21 NOTE — Progress Notes (Signed)
PULMONARY / CRITICAL CARE MEDICINE  Name: Danny Suarez MRN: [id number] DOB: Aug 04, 1961    ADMISSION DATE:  Dec 08, 2013 CONSULTATION DATE:  11/28/12  REFERRING MD :  Neurosurgery PRIMARY SERVICE: Neurosurgery   CHIEF COMPLAINT:  Resp failure  BRIEF PATIENT DESCRIPTION: 53 yo admitted s/p R craniotomy for GBM resection / biopsy on 1/14. Was in ICU initially post-op, and eventually was sent to step down. 2/02 transferred back to ICU with resp distress, AMS and fever. Intubated  SIGNIFICANT EVENTS / STUDIES:  1/13   Admit for planned craniotomy after abnormal MRI / CT 1/14   Craniotomy / tumor resection / bx per Dr. Kathyrn Sheriff 1/31   PEG placement 2/2     Acute respiratory failure - transferred to ICU, intubated 2/2     CT head - evolving inferior right frontal lobe infarct.  LINES / TUBES: ETT 1/15 >>> 1/17 RIJ CVL 1/14 >>  PEG 1/31 >>  ETT 2/02 >>   CULTURES: Urine 2/02 >>  resp 2/02 >> mod strep resp 2/2>>>serratia Blood 2/02 >>   ANTIBIOTICS: Ancef 1/14 >>>1/15 Zosyn 2/2 >> 2/5 Vancomycin 2/2 >>2/5 Levaquin 2/2 >> 2/5 Ceftriaxone 2/5>>>  SUBJECTIVE:   Has tolerated PSV Note Palliative Care discussions, plan to extubate today 2/7  VITAL SIGNS: Temp:  [98 F (36.7 C)-101.3 F (38.5 C)] 98.4 F (36.9 C) (02/07 0742) Pulse Rate:  [70-114] 94 (02/07 0800) Resp:  [12-34] 21 (02/07 0800) BP: (116-148)/(58-87) 138/78 mmHg (02/07 0800) SpO2:  [97 %-100 %] 100 % (02/07 0800) FiO2 (%):  [40 %] 40 % (02/07 0840) Weight:  [124.7 kg (274 lb 14.6 oz)] 124.7 kg (274 lb 14.6 oz) (02/07 0500) Vent Mode:  [-] CPAP;PSV FiO2 (%):  [40 %] 40 % Set Rate:  [16 bmp] 16 bmp Vt Set:  [550 mL] 550 mL PEEP:  [5 cmH20] 5 cmH20 Pressure Support:  [8 cmH20] 8 cmH20 Plateau Pressure:  [16 cmH20-24 cmH20] 16 cmH20  INTAKE / OUTPUT: Intake/Output     02/06 0701 - 02/07 0700 02/07 0701 - 02/08 0700   I.V. (mL/kg) 1437.7 (11.5) 20.5 (0.2)   Other 300    NG/GT 2940 60   IV Piggyback  260    Total Intake(mL/kg) 4937.7 (39.6) 80.5 (0.6)   Urine (mL/kg/hr) 2320 (0.8)    Total Output 2320     Net +2617.7 +80.5        Stool Occurrence 3 x     PHYSICAL EXAMINATION: General:  Arouses to voice, not following commands for me HEENT: scalp laceration well healed, ETT PULM: Rronchi bilat CV: RRR, no mgr AB: BS+, soft, nontender Ext: pitting edema bilat Neuro: on vent, periodically follows commands, opens eyes  CBC  Recent Labs Lab 12/19/13 0520 12/20/13 0410 12/21/13 0409  WBC 7.2 5.2 5.1  HGB 9.7* 10.0* 9.5*  HCT 30.2* 31.2* 30.2*  PLT 175 193 204   Coag's No results found for this basename: APTT, INR,  in the last 168 hours  BMET  Recent Labs Lab 12/18/13 0500 12/19/13 0520 12/20/13 0410  NA 149* 148* 150*  K 3.3* 3.0* 3.8  CL 111 106 112  CO2 24 25 25   BUN 34* 40* 37*  CREATININE 1.07 1.42* 1.14  GLUCOSE 171* 192* 185*   Electrolytes  Recent Labs Lab 12/18/13 0500 12/19/13 0520 12/20/13 0410  CALCIUM 8.1* 7.9* 8.5  MG 2.2 1.9  --   PHOS 1.9* 2.9  --    Sepsis Markers  Recent Labs Lab 12/16/13 1530 12/16/13 1635  12/17/13 0535 12/18/13 0500  LATICACIDVEN  --  2.7*  --   --   PROCALCITON 0.63  --  4.62 2.93   ABG  Recent Labs Lab 12/18/13 0335 12/19/13 0400  PHART 7.389 7.489*  PCO2ART 41.2 33.4*  PO2ART 106.0* 141.0*   Liver Enzymes  Recent Labs Lab 12/20/13 0410  AST 99*  ALT 140*  ALKPHOS 152*  BILITOT 0.3  ALBUMIN 2.1*   Cardiac Enzymes No results found for this basename: TROPONINI, PROBNP,  in the last 168 hours Glucose  Recent Labs Lab 12/20/13 1151 12/20/13 1605 12/20/13 1944 12/20/13 2328 12/21/13 0313 12/21/13 0825  GLUCAP 150* 129* 131* 158* 157* 123*   CXR: hazy LLL, ett wnl, atx   ASSESSMENT / PLAN:  PULMONARY A: Acute Respiratory Failure > vent mechanics OK for extubation PNA unlikely > infiltrate cleared PE  OSA on home CPAP CTA with a PE P:   - extubation 2/7 based on goals of  care conversation - Pulmonary Hygeine - CXR for LLL changes - Continue heparin for PE. - Patient is not a trach candidate given cancer prognosis, would not improve quality   NEUROLOGIC A:   Glioblastoma multiforme s/p craniotomy, evolving CVA Acute encephalopathy > unlikely that he is going to do well overall, trach will not change quality of life P:   - Minimize sedation as able. - GBM per neurosurgery - Supportive Care.    CARDIOVASCULAR A:  SIRS/sepsis: resolved  P:  - Continue current care - goal volume status slightly positive  RENAL A:   Hyponatremia >  Hypok, hypomag Error chem 2/3>>?  P:   - goal slightly positive given Na - BMET in AM. - Replace electrolytes, k, mag  INFECTIOUS A:   Sepsis - aspiration pneumonitis?> resolved P:   -control temps -narrowed to ceftraixone through 2/8  GASTROINTESTINAL A:    Nutrition GI Px  P:   - TF per Nutritionist - Protonix -dc colace  HEMATOLOGIC A:   Wafarin at home prior to surgery  DVT / PE P:   - F/U CBC. -heparin gtt  ENDOCRINE A:  Hyperglycemia  P:   - CBG testing - SSI - Lantus  Today's Summary:  Extubation 2/7 with no plan for reintubation. I will write the extubation orders when family indicates ready  CC time 35 min.  Baltazar Apo, MD, PhD 12/21/2013, 9:34 AM [facility] [phone] or if no answer [phone]

## 2013-12-21 NOTE — Progress Notes (Signed)
ANTICOAGULATION CONSULT NOTE - Follow Up Consult  Pharmacy Consult for Heparin Indication: pulmonary embolus  Allergies  Allergen Reactions  . Mobic [Meloxicam]   . Nuvigil [Armodafinil]   . Prednisolone     Patient Measurements: Height: 6\' 4"  (193 cm) Weight: 274 lb 14.6 oz (124.7 kg) IBW/kg (Calculated) : 86.8 Heparin Dosing Weight:   Vital Signs: Temp: 98.4 F (36.9 C) (02/07 0742) Temp src: Axillary (02/07 0742) BP: 151/88 mmHg (02/07 1000) Pulse Rate: 93 (02/07 1000)  Labs:  Recent Labs  12/19/13 0520 12/20/13 0410 12/21/13 0409  HGB 9.7* 10.0* 9.5*  HCT 30.2* 31.2* 30.2*  PLT 175 193 204  HEPARINUNFRC 0.40 0.34 0.34  CREATININE 1.42* 1.14  --     Estimated Creatinine Clearance: 110.6 ml/min (by C-G formula based on Cr of 1.14).   Assessment: 53 yo M admitted 11/30/2013 for R craniotomy for Glioblastoma multiforme resection/bx, found to have a submassive PE 12/16/13 with VDRF 2/2, started abx for rule out sepsis as well  Anticoag: (Warf PTA) PE, CCM and neurosurgery agree risk of PE outweigh risk of bleed, heparin no boluses, keep goal HL low end of range.  HL = 0.34, Hgb down slightly to 9.5.  ID: rule out sepsis, Tmax = 101.3, WBC = 5.1. F/u stop date 2/8.  Vancomycin 2/2>2/5 Levaquin 2/2>2/5 Zosyn 2/2>2/5 Rocephin 2/5>  Resp 2/2> Serratia, cefazolin R, Moderate Strep Blood 2/2> NGTD  Cardiovascular: 151/88, HR 93. No cardiac meds  Endocrinology: SSI, CBGs ok 123-158, SSI, lantus  Gastrointestinal / Nutrition: PEG tube (1/31) on Vital High Protein/Free water/Prostat, IV PPI  Neurology: s/p craniotomy (1/14), CT head -- evolving right frontal lobe infarct on IV Keppra  Nephrology: Scr = 1.14, Na high 150 on free water  Pulmonary: OSA, Intubated (2/2), not a trach candidate. Extubation 2/7 with no plan for reintubation. Awaiting family.  Hematology / Oncology: Hgb down slightly to 8.5. Glioblastoma multiforme s/p craniotomy  PTA Medication  Issues: no issues  Best Practices: Heparin  Goal of Therapy:  Heparin level 0.3-0.7 units/ml Monitor platelets by anticoagulation protocol: Yes   Plan:  Continue IV heparin at 2050 units/hr   Chinenye Katzenberger S. Alford Highland, PharmD, BCPS Clinical Staff Pharmacist Pager [phone]  Eilene Ghazi Stillinger 12/21/2013,11:06 AM

## 2013-12-21 NOTE — Procedures (Signed)
Extubation Procedure Note  Patient Details:   Name: Danny Suarez DOB: February 03, 1961 MRN: [id number]   Airway Documentation:     Evaluation  O2 sats: stable throughout Complications: No apparent complications Patient did tolerate procedure well. Bilateral Breath Sounds: Diminished Suctioning: Airway No  Gonzella Lex 12/21/2013, 2:03 PM

## 2013-12-22 LAB — GLUCOSE, CAPILLARY
GLUCOSE-CAPILLARY: 132 mg/dL — AB (ref 70–99)
Glucose-Capillary: 116 mg/dL — ABNORMAL HIGH (ref 70–99)
Glucose-Capillary: 125 mg/dL — ABNORMAL HIGH (ref 70–99)
Glucose-Capillary: 130 mg/dL — ABNORMAL HIGH (ref 70–99)
Glucose-Capillary: 131 mg/dL — ABNORMAL HIGH (ref 70–99)
Glucose-Capillary: 131 mg/dL — ABNORMAL HIGH (ref 70–99)
Glucose-Capillary: 137 mg/dL — ABNORMAL HIGH (ref 70–99)

## 2013-12-22 LAB — CULTURE, BLOOD (ROUTINE X 2)
Culture: NO GROWTH
Culture: NO GROWTH

## 2013-12-22 LAB — CBC
HEMATOCRIT: 30.3 % — AB (ref 39.0–52.0)
Hemoglobin: 9.7 g/dL — ABNORMAL LOW (ref 13.0–17.0)
MCH: 27.5 pg (ref 26.0–34.0)
MCHC: 32 g/dL (ref 30.0–36.0)
MCV: 85.8 fL (ref 78.0–100.0)
Platelets: 220 10*3/uL (ref 150–400)
RBC: 3.53 MIL/uL — ABNORMAL LOW (ref 4.22–5.81)
RDW: 15.1 % (ref 11.5–15.5)
WBC: 5.1 10*3/uL (ref 4.0–10.5)

## 2013-12-22 LAB — HEPARIN LEVEL (UNFRACTIONATED): HEPARIN UNFRACTIONATED: 0.51 [IU]/mL (ref 0.30–0.70)

## 2013-12-22 MED ORDER — LEVETIRACETAM 100 MG/ML PO SOLN
500.0000 mg | Freq: Two times a day (BID) | ORAL | Status: DC
  Administered 2013-12-22 – 2013-12-23 (×3): 500 mg
  Filled 2013-12-22 (×6): qty 5

## 2013-12-22 MED ORDER — PANTOPRAZOLE SODIUM 40 MG PO PACK
40.0000 mg | PACK | Freq: Every day | ORAL | Status: DC
  Administered 2013-12-23: 40 mg
  Filled 2013-12-22 (×2): qty 20

## 2013-12-22 NOTE — Progress Notes (Signed)
Subjective: Patient reports Extubated  Objective: Vital signs in last 24 hours: Temp:  [98.4 F (36.9 C)-101.3 F (38.5 C)] 101.3 F (38.5 C) (02/08 0800) Pulse Rate:  [84-97] 95 (02/08 0800) Resp:  [29-41] 34 (02/08 0800) BP: (117-153)/(67-88) 134/76 mmHg (02/08 0800) SpO2:  [93 %-100 %] 94 % (02/08 0800) FiO2 (%):  [40 %] 40 % (02/07 1300) Weight:  [122.2 kg (269 lb 6.4 oz)] 122.2 kg (269 lb 6.4 oz) (02/08 0400)  Intake/Output from previous day: 02/07 0701 - 02/08 0700 In: 3752 [I.V.:492; NG/GT:3000; IV Piggyback:260] Out: 2585 [Urine:2350; Stool:235] Intake/Output this shift: Total I/O In: 80.5 [I.V.:20.5; NG/GT:60] Out: 235 [Urine:235]  Trace eye opening not following commands grimace to deep pain  Lab Results:  Recent Labs  12/21/13 0409 12/22/13 0430  WBC 5.1 5.1  HGB 9.5* 9.7*  HCT 30.2* 30.3*  PLT 204 220   BMET  Recent Labs  12/20/13 0410  NA 150*  K 3.8  CL 112  CO2 25  GLUCOSE 185*  BUN 37*  CREATININE 1.14  CALCIUM 8.5    Studies/Results: No results found.  Assessment/Plan: Breathing comfortably off the vent however neurologic status remained severely compromised.  LOS: 26 days  Medical treatment and comfort care. Appreciate help and intervention of palliative care team.   Earleen Newport 12/22/2013, 9:34 AM

## 2013-12-22 NOTE — Progress Notes (Signed)
Patient Danny Suarez      DOB: 1961-11-05      DUK:[id number]   Palliative Medicine Team at [facility] Progress Note    Subjective:  No family at beside or in waiting area.  They have been visiting. Patient getting a bath.  Remote stare not following commands or attempting to verbalize . Left message for spouse to continue goals of care related to disposition and tube feeding.     Filed Vitals:   12/22/13 1200  BP: 120/76  Pulse: 89  Temp: 99.4 F (37.4 C)  Resp: 37   Physical exam: General: no acute distress, staring not verbal  Incisions in scalp stable Chest decreased CVs: regular S1, S2 Abd: obese peg intact Ext: edema of hands and feet Neuro: severly impaired. Not following commands   Assessment and plan: 53 yr old white male with grade IV glioblastoma s/p resection complicated by stroke, and saddle PE.  Patient extubated yesterday with no intent on reintubation.  Spouse having a hard time with decision making .  Next steps are to help her work through location of care, and possible transition to full comfort with dc of heparin and tube feeds. I f she is not willing to consider dc tube feeds could ask hospice home to consider gradual work toward this while permitting transition to that environment.  1. DNR/ DNI  2.  Left message to talk with spouse further about goals of care. 3.  Low threshold for opiate drip if patient develops respiratory distress.  Total time 15 min   Cheresa Siers L. Lovena Le, MD MBA The Palliative Medicine Team at [facility] Phone: [phone] Pager: [phone]

## 2013-12-22 NOTE — Progress Notes (Signed)
ANTICOAGULATION CONSULT NOTE - Follow Up Consult  Pharmacy Consult for Heparin Indication: pulmonary embolus  Allergies  Allergen Reactions  . Mobic [Meloxicam]   . Nuvigil [Armodafinil]   . Prednisolone     Patient Measurements: Height: 6\' 4"  (193 cm) Weight: 269 lb 6.4 oz (122.2 kg) IBW/kg (Calculated) : 86.8 Heparin Dosing Weight: 112.6 kg  Vital Signs: Temp: 101.3 F (38.5 C) (02/08 0800) Temp src: Axillary (02/08 0800) BP: 134/76 mmHg (02/08 0800) Pulse Rate: 95 (02/08 0800)  Labs:  Recent Labs  12/20/13 0410 12/21/13 0409 12/22/13 0430  HGB 10.0* 9.5* 9.7*  HCT 31.2* 30.2* 30.3*  PLT 193 204 220  HEPARINUNFRC 0.34 0.34 0.51  CREATININE 1.14  --   --     Estimated Creatinine Clearance: 109.5 ml/min (by C-G formula based on Cr of 1.14).   Assessment: 53 yo M admitted 11/18/2013 for R craniotomy for Glioblastoma multiforme resection/bx, found to have a submassive PE 12/16/13 with VDRF 2/2, started abx for rule out sepsis as well  Anticoag: (Warf PTA) PE, CCM and neurosurgery agree risk of PE outweigh risk of bleed, heparin no boluses, keep goal HL low end of range.  HL = 0.51, Hgb 9.7  ID: rule out sepsis, Tmax = 101.3, WBC = 5.1. Abx complete  Vancomycin 2/2>2/5 Levaquin 2/2>2/5 Zosyn 2/2>2/5 Rocephin 2/5>2/8  Resp 2/2> Serratia, cefazolin R, Beta hemolytic strep Blood 2/2> NGTD  Cardiovascular: 134/76, HR 95. No cardiac meds  Endocrinology: SSI, CBGs ok 110-137, SSI, lantus  Gastrointestinal / Nutrition: PEG tube (1/31) on Vital High Protein/Free water/Prostat, IV PPI change to per tube  Neurology: s/p craniotomy (1/14), CT head 12/16/13-- evolving right frontal lobe infarct on tube Keppra  Nephrology: Scr = 1.14, Na high 150 on free water  Pulmonary: OSA, Intubated (2/2), not a trach candidate. Extubation 2/7 with no plan for reintubation. Awaiting family.  Hematology / Oncology: Hgb 9.7. Glioblastoma multiforme s/p craniotomy  PTA Medication  Issues: no issues  Best Practices: Heparin  Goal of Therapy:  Heparin level 0.3-0.7 units/ml Monitor platelets by anticoagulation protocol: Yes   Plan:  Continue IV heparin at 2050 units/hr   Brendt Dible S. Alford Highland, PharmD, [facility] Clinical Staff Pharmacist Pager [phone]  Eilene Ghazi Stillinger 12/22/2013,11:10 AM

## 2013-12-22 NOTE — Progress Notes (Signed)
PULMONARY / CRITICAL CARE MEDICINE  Name: Danny Suarez MRN: [id number] DOB: Dec 09, 1960    ADMISSION DATE:  11/25/2013 CONSULTATION DATE:  11/28/12  REFERRING MD :  Neurosurgery PRIMARY SERVICE: Neurosurgery   CHIEF COMPLAINT:  Resp failure  BRIEF PATIENT DESCRIPTION: 53 yo admitted s/p R craniotomy for GBM resection / biopsy on 1/14. Was in ICU initially post-op, and eventually was sent to step down. 2/02 transferred back to ICU with resp distress, AMS and fever. Intubated  SIGNIFICANT EVENTS / STUDIES:  1/13   Admit for planned craniotomy after abnormal MRI / CT 1/14   Craniotomy / tumor resection / bx per Dr. Kathyrn Sheriff 1/31   PEG placement 2/2     Acute respiratory failure - transferred to ICU, intubated 2/2     CT head - evolving inferior right frontal lobe infarct.  LINES / TUBES: ETT 1/15 >>> 1/17 RIJ CVL 1/14 >>  PEG 1/31 >>  ETT 2/02 >> 2/7  CULTURES: Urine 2/02 >> negative Urine 2/3 >> negative resp 2/02 >> group A strep resp 2/2>>>serratia Blood 2/02 >>   ANTIBIOTICS: Ancef 1/14 >>>1/15 Zosyn 2/2 >> 2/5 Vancomycin 2/2 >>2/5 Levaquin 2/2 >> 2/5 Ceftriaxone 2/5 >> 2/8  SUBJECTIVE:   Extubated Opens eyes with stim  VITAL SIGNS: Temp:  [98.4 F (36.9 C)-101.3 F (38.5 C)] 101.3 F (38.5 C) (02/08 0800) Pulse Rate:  [84-97] 95 (02/08 0800) Resp:  [29-41] 34 (02/08 0800) BP: (117-153)/(67-87) 134/76 mmHg (02/08 0800) SpO2:  [93 %-100 %] 94 % (02/08 0800) FiO2 (%):  [40 %] 40 % (02/07 1300) Weight:  [122.2 kg (269 lb 6.4 oz)] 122.2 kg (269 lb 6.4 oz) (02/08 0400) Vent Mode:  [-] CPAP;PSV FiO2 (%):  [40 %] 40 % PEEP:  [5 cmH20] 5 cmH20 Pressure Support:  [8 cmH20] 8 cmH20  INTAKE / OUTPUT: Intake/Output     02/07 0701 - 02/08 0700 02/08 0701 - 02/09 0700   I.V. (mL/kg) 492 (4) 63.9 (0.5)   Other  40   NG/GT 3000 187   IV Piggyback 260 50   Total Intake(mL/kg) 3752 (30.7) 340.9 (2.8)   Urine (mL/kg/hr) 2350 (0.8) 360 (0.9)   Stool 235 (0.1)    Total Output 2585 360   Net +1167 -19.1        Stool Occurrence 1 x     PHYSICAL EXAMINATION: General:  opens eyes, otherwise does not interact HEENT: scalp laceration well healed PULM: Rronchi bilat CV: RRR, no mgr AB: BS+, soft, nontender Ext: pitting edema bilat Neuro: on vent, periodically follows commands, opens eyes  CBC  Recent Labs Lab 12/20/13 0410 12/21/13 0409 12/22/13 0430  WBC 5.2 5.1 5.1  HGB 10.0* 9.5* 9.7*  HCT 31.2* 30.2* 30.3*  PLT 193 204 220   Coag's No results found for this basename: APTT, INR,  in the last 168 hours  BMET  Recent Labs Lab 12/18/13 0500 12/19/13 0520 12/20/13 0410  NA 149* 148* 150*  K 3.3* 3.0* 3.8  CL 111 106 112  CO2 24 25 25   BUN 34* 40* 37*  CREATININE 1.07 1.42* 1.14  GLUCOSE 171* 192* 185*   Electrolytes  Recent Labs Lab 12/18/13 0500 12/19/13 0520 12/20/13 0410  CALCIUM 8.1* 7.9* 8.5  MG 2.2 1.9  --   PHOS 1.9* 2.9  --    Sepsis Markers  Recent Labs Lab 12/16/13 1530 12/16/13 1635 12/17/13 0535 12/18/13 0500  LATICACIDVEN  --  2.7*  --   --   PROCALCITON 0.63  --  4.62 2.93   ABG  Recent Labs Lab 12/18/13 0335 12/19/13 0400  PHART 7.389 7.489*  PCO2ART 41.2 33.4*  PO2ART 106.0* 141.0*   Liver Enzymes  Recent Labs Lab 12/20/13 0410  AST 99*  ALT 140*  ALKPHOS 152*  BILITOT 0.3  ALBUMIN 2.1*   Cardiac Enzymes No results found for this basename: TROPONINI, PROBNP,  in the last 168 hours Glucose  Recent Labs Lab 12/21/13 1324 12/21/13 1557 12/21/13 1946 12/22/13 0004 12/22/13 0403 12/22/13 0754  GLUCAP 134* 110* 119* 132* 137* 130*     ASSESSMENT / PLAN:  PULMONARY A: Acute Respiratory Failure due to MS, extubated PNA unlikely > infiltrate cleared PE  OSA on home CPAP PE on heparin P:   - extubated 2/7 based on goals of care conversation. It would appear that family is not ready to stop heparin, other meds and pursue full comfort. I would recommend working  towards this - Pulmonary Hygeine - Continue heparin for PE, although efficacy in this setting questionable. Treating the family at this point - Patient is not a trach candidate given cancer prognosis, would not improve quality   NEUROLOGIC A:   Glioblastoma multiforme s/p craniotomy, evolving CVA Acute encephalopathy > unlikely that he is going to do well overall, trach will not change quality of life P:   - Supportive Care, comfort care   CARDIOVASCULAR A:  SIRS/sepsis: resolved  P:  - Continue current care - goal volume status slightly positive  RENAL A:   Hyponatremia >  Hypok, hypomag  P:   - BMET in AM. - Replace electrolytes as indicated  INFECTIOUS A:   Sepsis - aspiration pneumonitis?> resolved P:   -ceftraixone through 2/8 > d/c today  GASTROINTESTINAL A:    Nutrition GI Px  P:   - TF  - Protonix  HEMATOLOGIC A:   Wafarin at home prior to surgery  DVT / PE P:   - F/U CBC. - heparin gtt  ENDOCRINE A:  Hyperglycemia  P:   - CBG testing - SSI - Lantus  GLOBAL I advocate d/c all non-comfort based meds, transition to a palliative care floor. He may ultimately be a candidate for [employer]. His wife will need to become more accepting before this can happen. His abx finish 2/8 and I will d/c. For now he remains on heparin and other meds.   PCCM will sign off. Please call if we can assist you further.    Baltazar Apo, MD, PhD 12/22/2013, 10:19 AM [facility] [phone] or if no answer [phone]

## 2013-12-23 LAB — GLUCOSE, CAPILLARY
GLUCOSE-CAPILLARY: 113 mg/dL — AB (ref 70–99)
GLUCOSE-CAPILLARY: 160 mg/dL — AB (ref 70–99)
Glucose-Capillary: 132 mg/dL — ABNORMAL HIGH (ref 70–99)
Glucose-Capillary: 147 mg/dL — ABNORMAL HIGH (ref 70–99)
Glucose-Capillary: 162 mg/dL — ABNORMAL HIGH (ref 70–99)
Glucose-Capillary: 170 mg/dL — ABNORMAL HIGH (ref 70–99)

## 2013-12-23 LAB — CBC
HEMATOCRIT: 30.7 % — AB (ref 39.0–52.0)
HEMOGLOBIN: 9.9 g/dL — AB (ref 13.0–17.0)
MCH: 27.3 pg (ref 26.0–34.0)
MCHC: 32.2 g/dL (ref 30.0–36.0)
MCV: 84.8 fL (ref 78.0–100.0)
Platelets: 246 10*3/uL (ref 150–400)
RBC: 3.62 MIL/uL — ABNORMAL LOW (ref 4.22–5.81)
RDW: 15 % (ref 11.5–15.5)
WBC: 4.1 10*3/uL (ref 4.0–10.5)

## 2013-12-23 LAB — HEPARIN LEVEL (UNFRACTIONATED)
HEPARIN UNFRACTIONATED: 0.43 [IU]/mL (ref 0.30–0.70)
Heparin Unfractionated: 0.6 IU/mL (ref 0.30–0.70)

## 2013-12-23 MED ORDER — PRO-STAT SUGAR FREE PO LIQD
30.0000 mL | Freq: Two times a day (BID) | ORAL | Status: DC
  Administered 2013-12-23: 30 mL
  Filled 2013-12-23 (×3): qty 30

## 2013-12-23 MED ORDER — OSMOLITE 1.5 CAL PO LIQD
1000.0000 mL | ORAL | Status: DC
  Administered 2013-12-23 – 2013-12-24 (×2): 1000 mL
  Filled 2013-12-23 (×4): qty 1000

## 2013-12-23 NOTE — Progress Notes (Signed)
NUTRITION FOLLOW UP  Intervention:   Osmolite 1.5 @ 55 ml/hr with 30 ml Prostat 2 times a day.  TF regimen provides:  2180 kcal, 112 grams protein, and 1003 ml H2O.  Current free water: 750 ml every 8 hours providing 2250 ml H2O. Total free water: 3253 ml   Nutrition Dx:   Inadequate oral intake related to inability to eat as evidenced by NPO status; ongoing.   Goal:   Pt to meet >/=90% of their estimated nutrition needs.  Monitor:   Weight trends, labs, TF rate and tolerance  Assessment:   Pt admitted due to abnormal MRI and CT of his brain 1/13. 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 Pt had craniotomy for resection/biopsy on 1/14. Per MRI 1/16 pt with acute strokes (right basal ganglia, right thalamus, and inferior right frontal lobe).   Pt extubated 1/18.   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 Per neurosurgeon, plan to refer patient for medical and radiation oncology evaluation in 2 weeks, and noted that pt would likely not benefit from repeat resection.   PEG tube placed at bedside on 1/30.   2/4-Pt was re-intubated 2/2 and transferred to 71M. TF's held when re-intubated and restarted 2/4.  Pt discussed during ICU rounds and with RN.   2/9-Patient is currently extubated and on nasal cannula. Pt was discussed during rounds. Pt is currently continuing enteral tube feeding- will change to Osmolite 1.5 with prostat. Per MD notes, recommending transitioning to comfort care however wife is having a hard time making decision per Palliative care note. Palliative care meeting not yet scheduled.  Na elevated and trending up. 750 ml free water every 8 hours added 2/6.   Height: Ht Readings from Last 1 Encounters:  12/16/13 6\' 4"  (1.93 m)    Weight Status:   Wt Readings from Last 1 Encounters:  12/23/13 265 lb 6.9 oz (120.4 kg)  12/18/13 277 lb (126 kg) Admission weight 299 lb (135.6 kg)  Body mass index is 32.32 kg/(m^2). Obese Class  II  Re-estimated needs:  Kcal: 2150-2350 Protein: 110-130 grams Fluid: >2 L/day  Skin: Head and knee incisions  Diet Order: NPO   Intake/Output Summary (Last 24 hours) at 12/23/13 1025 Last data filed at 12/23/13 0900  Gross per 24 hour  Intake 4332.11 ml  Output   2130 ml  Net 2202.11 ml    Last BM: 2/9   Labs:   Recent Labs Lab 12/18/13 0500 12/19/13 0520 12/20/13 0410  NA 149* 148* 150*  K 3.3* 3.0* 3.8  CL 111 106 112  CO2 24 25 25   BUN 34* 40* 37*  CREATININE 1.07 1.42* 1.14  CALCIUM 8.1* 7.9* 8.5  MG 2.2 1.9  --   PHOS 1.9* 2.9  --   GLUCOSE 171* 192* 185*    CBG (last 3)   Recent Labs  12/22/13 2341 12/23/13 0329 12/23/13 0742  GLUCAP 131* 113* 160*    Scheduled Meds: . antiseptic oral rinse  15 mL Mouth Rinse q12n4p  . chlorhexidine  15 mL Mouth Rinse BID  . feeding supplement (PRO-STAT SUGAR FREE 64)  30 mL Per Tube QID  . free water  750 mL Per Tube Q8H  . insulin aspart  0-20 Units Subcutaneous Q4H  . insulin glargine  10 Units Subcutaneous QHS  . levETIRAcetam  500 mg Per Tube BID  . pantoprazole sodium  40 mg Per Tube Daily  . sodium chloride  10-40 mL Intracatheter Q12H  Continuous Infusions: . feeding supplement (VITAL HIGH PROTEIN) 1,000 mL (12/23/13 0900)  . heparin 2,050 Units/hr (12/23/13 0900)    [place] Intern Pager: [phone]  Intern note/chart reviewed. Revisions made.  Lane, [place], [place] Pager [phone] After Hours Pager

## 2013-12-23 NOTE — Consult Note (Signed)
I have reviewed this case with our NP and agree with the Assessment and Plan as stated.  Ranika Mcniel L. Meeka Cartelli, MD MBA The Palliative Medicine Team at [facility] Team Phone: [phone] Pager: [phone]   

## 2013-12-23 NOTE — Progress Notes (Signed)
Pharmacy Note-Anticoagulation  Pharmacy Consult :  53 y.o. male is currently on Heparin infusion for pulmonary embolism.   Heparin Dosing Wt:  112.6 kg  Latest Labs : Hematology :  Recent Labs  12/21/13 0409 12/22/13 0430 12/23/13 0423  HGB 9.5* 9.7* 9.9*  HCT 30.2* 30.3* 30.7*  PLT 204 220 246  HEPARINUNFRC 0.34 0.51 0.60    Lab Results  Component Value Date   HEPARINUNFRC 0.60 12/23/2013   HEPARINUNFRC 0.51 12/22/2013   HEPARINUNFRC 0.34 12/21/2013        HGB 9.9* 12/23/2013   HGB 9.7* 12/22/2013   HGB 9.5* 12/21/2013    Current Medication[s] Include: Scheduled:  Scheduled:  . antiseptic oral rinse  15 mL Mouth Rinse q12n4p  . chlorhexidine  15 mL Mouth Rinse BID  . feeding supplement (PRO-STAT SUGAR FREE 64)  30 mL Per Tube QID  . free water  750 mL Per Tube Q8H  . insulin aspart  0-20 Units Subcutaneous Q4H  . insulin glargine  10 Units Subcutaneous QHS  . levETIRAcetam  500 mg Per Tube BID  . pantoprazole sodium  40 mg Per Tube Daily  . sodium chloride  10-40 mL Intracatheter Q12H   Infusion[s]: Infusions:  . feeding supplement (VITAL HIGH PROTEIN) 1,000 mL (12/23/13 1100)  . heparin 2,050 Units/hr (12/23/13 1100)   Antibiotic[s]: Anti-infectives   Start     Dose/Rate Route Frequency Ordered Stop   12/19/13 1000  cefTRIAXone (ROCEPHIN) 1 g in dextrose 5 % 50 mL IVPB  Status:  Discontinued     1 g 100 mL/hr over 30 Minutes Intravenous Every 24 hours 12/19/13 0853 12/22/13 1028   12/17/13 0300  vancomycin (VANCOCIN) 1,250 mg in sodium chloride 0.9 % 250 mL IVPB  Status:  Discontinued     1,250 mg 166.7 mL/hr over 90 Minutes Intravenous Every 12 hours 12/16/13 1500 12/19/13 0853   12/16/13 1600  piperacillin-tazobactam (ZOSYN) IVPB 3.375 g  Status:  Discontinued     3.375 g 12.5 mL/hr over 240 Minutes Intravenous Every 8 hours 12/16/13 1500 12/19/13 0853   12/16/13 1600  levofloxacin (LEVAQUIN) IVPB 750 mg  Status:  Discontinued     750 mg 100 mL/hr over 90  Minutes Intravenous Every 24 hours 12/16/13 1500 12/19/13 0853   12/16/13 1515  vancomycin (VANCOCIN) 1,500 mg in sodium chloride 0.9 % 500 mL IVPB     1,500 mg 250 mL/hr over 120 Minutes Intravenous  Once 12/16/13 1500 12/16/13 1756   01-03-2014 1000  cefOXitin (MEFOXIN) 1 g in dextrose 5 % 50 mL IVPB     1 g 100 mL/hr over 30 Minutes Intravenous  Once 12/12/13 1251 2014/01/03 1117   01-03-14 1000  ceFAZolin (ANCEF) IVPB 1 g/50 mL premix     1 g 100 mL/hr over 30 Minutes Intravenous  Once 2014-01-03 0931 Jan 03, 2014 1045   11/28/2013 2359  ceFAZolin (ANCEF) IVPB 2 g/50 mL premix     2 g 100 mL/hr over 30 Minutes Intravenous Every 8 hours 12/11/2013 2325 11/28/13 0839   12/13/2013 2127  ceFAZolin (ANCEF) 1-5 GM-% IVPB    Comments:  Lorane Gell   : cabinet override      11/23/2013 2127 11/28/13 0944   12/13/2013 2126  ceFAZolin (ANCEF) 1-5 GM-% IVPB    Comments:  Lorane Gell   : cabinet override      11/15/2013 2126 12/02/2013 2130   12/10/2013 1600  ceFAZolin (ANCEF) 1-5 GM-% IVPB    Comments:  Loreli Dollar   :  cabinet override      12-06-2013 1600 11/28/13 0414   12/06/13 1138  ceFAZolin (ANCEF) 2-3 GM-% IVPB SOLR    Comments:  Day, Dory   : cabinet override      06-Dec-2013 1138 December 06, 2013 2344   12/06/13 0600  ceFAZolin (ANCEF) IVPB 2 g/50 mL premix     2 g 100 mL/hr over 30 Minutes Intravenous On call to O.R. 11/18/2013 1401 12-06-2013 2125      Assessment :  Heparin level is slightly above stated goals, 0.6 units/ml.  No bleeding complications observed.  Hgb and Platelets stable.  Goal :  Heparin goal is 0.3 - 0.5 units/ml.  Plan : 1. Heparin will be decreased to 1800 units/hr.   The next Heparin Level will be due in 6 hours @ 2000 pm 2. Daily Heparin level, CBC while on Heparin.  Monitor for bleeding complications.   Brynja Marker, Craig Guess, Pharm.D. 12/23/2013  12:49 PM

## 2013-12-23 NOTE — Progress Notes (Signed)
No issues overnight. Pt minimally arousable.  EXAM:  BP 130/80  Pulse 89  Temp(Src) 100.9 F (38.3 C) (Axillary)  Resp 37  Ht 6\' 4"  (1.93 m)  Wt 120.4 kg (265 lb 6.9 oz)  BMI 32.32 kg/m2  SpO2 97%  Opens eyes with stimulation Not following commands Groans occasionally to pain No spontaneous movements of extremities  IMPRESSION:  53 y.o. male s/p crani for GBM, less responsive today - DNR/DNI in place  PLAN: - Cont current therapies with TF and IV heparin gtt - Transfer to neuroscience floor  I spoke with the patients wife today and updated her on the pts condition and plan for transfer to floor.

## 2013-12-23 NOTE — Progress Notes (Signed)
Patient Danny Suarez      DOB: June 01, 1961      VEH:[id number]   Palliative Medicine Team at [facility] Progress Note    Subjective:  Patient minimally responsive, soft moaning.  Wife call our phone but did not leave message , so I called her back.   She stated she could not get a ride today.  She remains unrealistically hopeful for recovery and not as open to discuss next measures.  Will continue to try to have her come to talk face to face.  She dose better in person.  I suspect that she will not be able to make decision to discontinue feed and meds for full comfort approach which will set [name] up for SNF placement and location of Death.     Filed Vitals:   Jan 10, 2014 1400  BP: 130/80  Pulse: 89  Temp:   Resp: 37   Physical exam:   General: does not open eyes to tactile or verbal stim moaning softly Chest : decreased with occasional crackle CVS; regular, S1, S2 Abd: obese soft, PEG in place Ext: swollen, no mottling Neuro: not following commands , not opening eyes as spontaneously to stimuli today.  Assessment and plan: 53 yr old white male with newly diagnosed grad IV glioblastoma, s/p resection with complication of stroke, saddle PEA and continued altered mentation.  1.  DNR/DNI  2.  Will continue to help wife Jan work through next choice which ideally for [name] would be to consider full comfort with Hospice home placement.  I don't know if Jan can move in that direction , she is avoiding the realities of his condition and is very overwhelmed.  3.  Dyspnea/Pain: low does morphine is appropriate.  Total time: 15 min   Zethan Alfieri L. Lovena Le, MD MBA The Palliative Medicine Team at [facility] Phone: [phone] Pager: [phone]

## 2013-12-23 NOTE — Progress Notes (Signed)
ANTICOAGULATION CONSULT NOTE - Follow Up Consult  Pharmacy Consult for Heparin Indication: pulmonary embolus  Allergies  Allergen Reactions  . Mobic [Meloxicam]   . Nuvigil [Armodafinil]   . Prednisolone     Patient Measurements: Height: 6\' 4"  (193 cm) Weight: 265 lb 6.9 oz (120.4 kg) IBW/kg (Calculated) : 86.8 Heparin Dosing Weight: 112.6 kg  Vital Signs: Temp: 102.1 F (38.9 C) (02/09 1956) Temp src: Axillary (02/09 1956) BP: 138/88 mmHg (02/09 1800) Pulse Rate: 94 (02/09 1800)  Labs:  Recent Labs  12/21/13 0409 12/22/13 0430 12/23/13 0423 12/23/13 1957  HGB 9.5* 9.7* 9.9*  --   HCT 30.2* 30.3* 30.7*  --   PLT 204 220 246  --   HEPARINUNFRC 0.34 0.51 0.60 0.43    Estimated Creatinine Clearance: 108.6 ml/min (by C-G formula based on Cr of 1.14).   Assessment: 53 yo M admitted 12-18-13 for R craniotomy for Glioblastoma multiforme resection/bx, found to have a submassive PE 12/16/13.  Heparin is at 1800 units/hr and at goal (HL= 0.43)  Goal of Therapy:  Heparin level= 0.3-0.5 Monitor platelets by anticoagulation protocol: Yes   Plan:   -No heparin changes needed -Daily heparin level and CBC  Hildred Laser, Pharm D 12/23/2013 9:23 PM

## 2013-12-24 ENCOUNTER — Encounter (HOSPITAL_COMMUNITY): Payer: Self-pay

## 2013-12-24 LAB — GLUCOSE, CAPILLARY
GLUCOSE-CAPILLARY: 162 mg/dL — AB (ref 70–99)
Glucose-Capillary: 154 mg/dL — ABNORMAL HIGH (ref 70–99)

## 2013-12-24 LAB — CBC
HEMATOCRIT: 30.6 % — AB (ref 39.0–52.0)
HEMOGLOBIN: 9.9 g/dL — AB (ref 13.0–17.0)
MCH: 27.4 pg (ref 26.0–34.0)
MCHC: 32.4 g/dL (ref 30.0–36.0)
MCV: 84.8 fL (ref 78.0–100.0)
Platelets: 278 10*3/uL (ref 150–400)
RBC: 3.61 MIL/uL — ABNORMAL LOW (ref 4.22–5.81)
RDW: 14.9 % (ref 11.5–15.5)
WBC: 4.8 10*3/uL (ref 4.0–10.5)

## 2013-12-24 LAB — HEPARIN LEVEL (UNFRACTIONATED): Heparin Unfractionated: 0.49 IU/mL (ref 0.30–0.70)

## 2013-12-24 MED ORDER — LORAZEPAM 2 MG/ML IJ SOLN
1.0000 mg | INTRAMUSCULAR | Status: DC
  Administered 2013-12-24 (×2): 1 mg via INTRAVENOUS
  Filled 2013-12-24 (×2): qty 1

## 2013-12-24 MED ORDER — MORPHINE SULFATE 10 MG/ML IJ SOLN
1.0000 mg/h | INTRAVENOUS | Status: DC
  Administered 2013-12-24: 1 mg/h via INTRAVENOUS
  Filled 2013-12-24: qty 10

## 2013-12-24 MED ORDER — SCOPOLAMINE 1 MG/3DAYS TD PT72
1.0000 | MEDICATED_PATCH | TRANSDERMAL | Status: DC
  Administered 2013-12-24: 1.5 mg via TRANSDERMAL
  Filled 2013-12-24: qty 1

## 2013-12-24 MED ORDER — FREE WATER: 100.0000 mL | Freq: Three times a day (TID) | Status: DC

## 2013-12-24 MED ORDER — MORPHINE SULFATE 2 MG/ML IJ SOLN
2.0000 mg | INTRAMUSCULAR | Status: DC | PRN
  Administered 2013-12-24: 2 mg via INTRAVENOUS

## 2013-12-24 MED ORDER — LORAZEPAM 2 MG/ML IJ SOLN
1.0000 mg | INTRAMUSCULAR | Status: DC | PRN
  Administered 2013-12-24: 1 mg via INTRAVENOUS
  Filled 2013-12-24: qty 1

## 2013-12-24 MED ORDER — OSMOLITE 1.5 CAL PO LIQD
1000.0000 mL | ORAL | Status: DC
  Filled 2013-12-24: qty 1000

## 2013-12-26 ENCOUNTER — Encounter: Payer: Self-pay | Admitting: Hematology & Oncology

## 2013-12-30 ENCOUNTER — Ambulatory Visit: Payer: BC Managed Care – PPO

## 2013-12-30 ENCOUNTER — Ambulatory Visit: Payer: BC Managed Care – PPO | Admitting: Radiation Oncology

## 2013-12-30 ENCOUNTER — Other Ambulatory Visit: Payer: BC Managed Care – PPO

## 2013-12-30 ENCOUNTER — Ambulatory Visit: Payer: BC Managed Care – PPO | Admitting: Oncology

## 2014-01-01 ENCOUNTER — Encounter (HOSPITAL_COMMUNITY): Payer: Self-pay | Admitting: Certified Registered"

## 2014-01-01 ENCOUNTER — Ambulatory Visit: Payer: BC Managed Care – PPO

## 2014-01-01 ENCOUNTER — Other Ambulatory Visit: Payer: BC Managed Care – PPO | Admitting: Lab

## 2014-01-01 ENCOUNTER — Ambulatory Visit: Payer: BC Managed Care – PPO | Admitting: Hematology & Oncology

## 2014-01-12 NOTE — Progress Notes (Signed)
Nutrition Brief Note  Chart reviewed. Pt now transitioning to comfort care.  No further nutrition interventions warranted at this time.  Please re-consult as needed.   Elania Crowl RD, LDN, CNSC [phone] Pager [phone] After Hours Pager    

## 2014-01-12 NOTE — Progress Notes (Signed)
Pt seen by RN early this am, noted to be in slight respiratory distress. Remains unresponsive.  EXAM:  BP 152/85  Pulse 98  Temp(Src) 100.5 F (38.1 C) (Oral)  Resp 33  Ht 6\' 4"  (1.93 m)  Wt 122.8 kg (270 lb 11.6 oz)  BMI 32.97 kg/m2  SpO2 95%  Occassional eye opening No spontaneous movements Not FC  IMPRESSION:  53 y.o. male s/p crani for GBM with saddle embolus, poss pna, declining from neurologic and pulmonary standpoint.  PLAN: - D/C active interventions including heparin gtt, free water, TF, lab draws - Comfort measures per palliative care service including morphine gtt, PRN ativan - Transfer to floor  I spoke with the patient's wife and her mother at bedside regarding progressive decline from respiratory and neurologic standpoint. I also explained to them that given his current status I do not believe the interventions we are administering will change his outcome and are likely contributing to pain/discomfort. I therefore recommended we d/c the active interventions are transition to a comfort care in which we provide measure to make sure he is comfortable and in no distress/pain. The pts wife and mother appeared to understand our discussion and agreed to proceed as above.

## 2014-01-12 NOTE — Progress Notes (Signed)
At 1820 noted patient expired, no respiration, no heart rate, no palpable pulses noted.Another RN verified death, MD on call and palliative team made aware. [place] donor called about the death with the referral number [id number], spoke with Retia Passe. Will endorse to incoming nurse. Family called in by the charge nurse.

## 2014-01-12 NOTE — Clinical Social Work Note (Signed)
Clinical Social Worker continuing to follow patient and family for support and discharge planning needs.  Patient and his wife are celebrating their 31st wedding anniversary today.  Patient wife was able to communicate at bedside with Dr. Kathyrn Sheriff and Wadie Lessen with Palliative Care, and has agreed to somewhat of a comfort care transition for patient at this time.  Patient and family are from the [facility] area and have had conversation regarding patient hopeful return to be closer to home.  Patient wife is emotionally coping with the idea of transitioning to comfort care.  RN caring for patient has established a good relationship with patient wife and has initiated conversation about [facility].  CSW provided RN with documentation for Hospice Home for patient wife to review.  CSW to follow up with patient wife tomorrow to further discuss the possibility of residential hospice placement.   Patient will likely not be covered by [facility] for return to SNF due to the idea of custodial care.  Patient family would have to pay privately if their choice was to have patient return to [facility] at discharge.  MD aware.  CSW has not submitted clinicals to [employer] due to patient inability to work with therapies at this time.  Patient remains unresponsive with family at bedside.  CSW remains available for support and to assist patient family with discharge needs once appropriate.  Barbette Or, [place]

## 2014-01-12 NOTE — Progress Notes (Signed)
Patient QR:[id number] Tassin      DOB: 05-28-1961      GPQ:[id number]   Palliative Medicine Team at [facility] Progress Note    Subjective: Called to respond to change in status for this patient.   [name] is moaning and in significant distress, increased secretions since I last saw him.  Spouse not at bedside.  Called her to return to hospital.  Stopped tube feeding for now as he is at significant risk for aspiration in current state and this is adding to his discomfort.   Filed Vitals:   01/04/2014 1240  BP: 118/69  Pulse: 106  Temp: 100.5 F (38.1 C)  Resp: 32   Physical exam:  General eyes closed, diaphoretic , increased work of breathing Chest copious coarse wet rhonchi CVS tachycardic, S1, S2 Abd: slightly distended, no grimace on palpation Ext: cool ,Positive swelling not mottled. Neuro: moaning, not responsive to tactile stimuli.   Assessment and plan: 53 yr old white male newly diagnosed with stage IV glioblastoma. Extubated Saturday with no intent to reintubate.  Family moving in comfort care mode, not explicitly outlined yet but morphine drip was initiated for significant respiratory distress.   1.  DNR/ DNI  2.  Respiratory distress:  Titrate morphine for symptom management  3.  Moaning and agitation:  Schedule ativan.   4.  Dysphagia: stop tube feeds for now he is into much distress will discuss with spouse when she responds to my call.   5. Terminal secretions:  Add scopolamine use atropine.   Total time 35 min 1245-120 pm   Saryn Cherry L. Lovena Le, MD MBA The Palliative Medicine Team at [facility] Phone: [phone] Pager: [phone]

## 2014-01-12 NOTE — Progress Notes (Signed)
UR completed.  Pt now DNR/DNI.  Sandi Mariscal, RN BSN [place] CCM Trauma/Neuro ICU Case Manager [phone]

## 2014-01-12 DEATH — deceased

## 2014-03-19 NOTE — Discharge Summary (Signed)
  Physician Discharge Summary  Patient ID: [name] MRN: [id number] DOB/AGE: March 04, 1961 53 y.o.  Admit date: 12-18-13 Discharge date: 01/03/2014  Admission Diagnoses: Brain tumor  Discharge Diagnoses: Same Active Problems:   Brain tumor   Acute respiratory failure   Altered mental status   Coma   Encephalopathy acute   Hyperglycemia   Sepsis(995.91)   Palliative care encounter   Weakness generalized   Discharged Condition: Deceased  Hospital Course:  Mrs. Danny Suarez is a 53 y.o. 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  The patient had a prolonged  Postoperative ICU course, with a significantly depressed mental status postoperatively.  He did slowly awakened, and was following commands periodically.  His ICU course was prolonged for several weeks given his mental status, however he was ultimately transitioned to the  Stepdown unit and  Gastrostomy tube was placed for entered all feedings as he could not sufficiently eat by mouth.  The patient while on the stepdown unit did experience worsening respiratory distress, and was transferred back to the intensive care unit.  At that time, he was diagnosed with a very large pulmonary embolus, and was started on heparin drip.  Prior to initiation of the heparin, CT scan was completed which demonstrated the possibility of some ventriculomegaly and therefore a ventricular catheter was placed.  Unfortunately, the catheter was accidentally discontinued.  After the initiation of heparin, the decision was made not to replace the catheter while on anticoagulation given that the intracranial pressure did not appear to be elevated.  The patient did not make significant improvement in his mental status, and a discussion was had with the family involving myself, the intensive care team, as well as the palliative care team. Given the diagnosis of glioblastoma, as  well as his mental status and multiple significant medical conditions including a pulmonary embolus, the decision was made to make the patient a DO NOT RESUSCITATE and DO NOT INTUBATE, and he was subsequently extubated.  The patient's mental status did not change, and he was found to be in significant respiratory distress.  His tube feedings were stopped, and he was placed on IV medications to prevent any discomfort.  The patient then expired on December 24, 2013 at approximately 7 o'clock p.m.  Treatments: Surgery - right frontotemporal craniotomy for debulking of tumor  Signed: Consuella Lose 03/19/2014, 4:40 PM

## 2015-07-09 IMAGING — CR DG CHEST 1V PORT
1 series · 1 of 1 positions shown · non-contrast
Comparison: None.

CLINICAL DATA: Check endotracheal tube

EXAM:
PORTABLE CHEST - 1 VIEW

[AP]
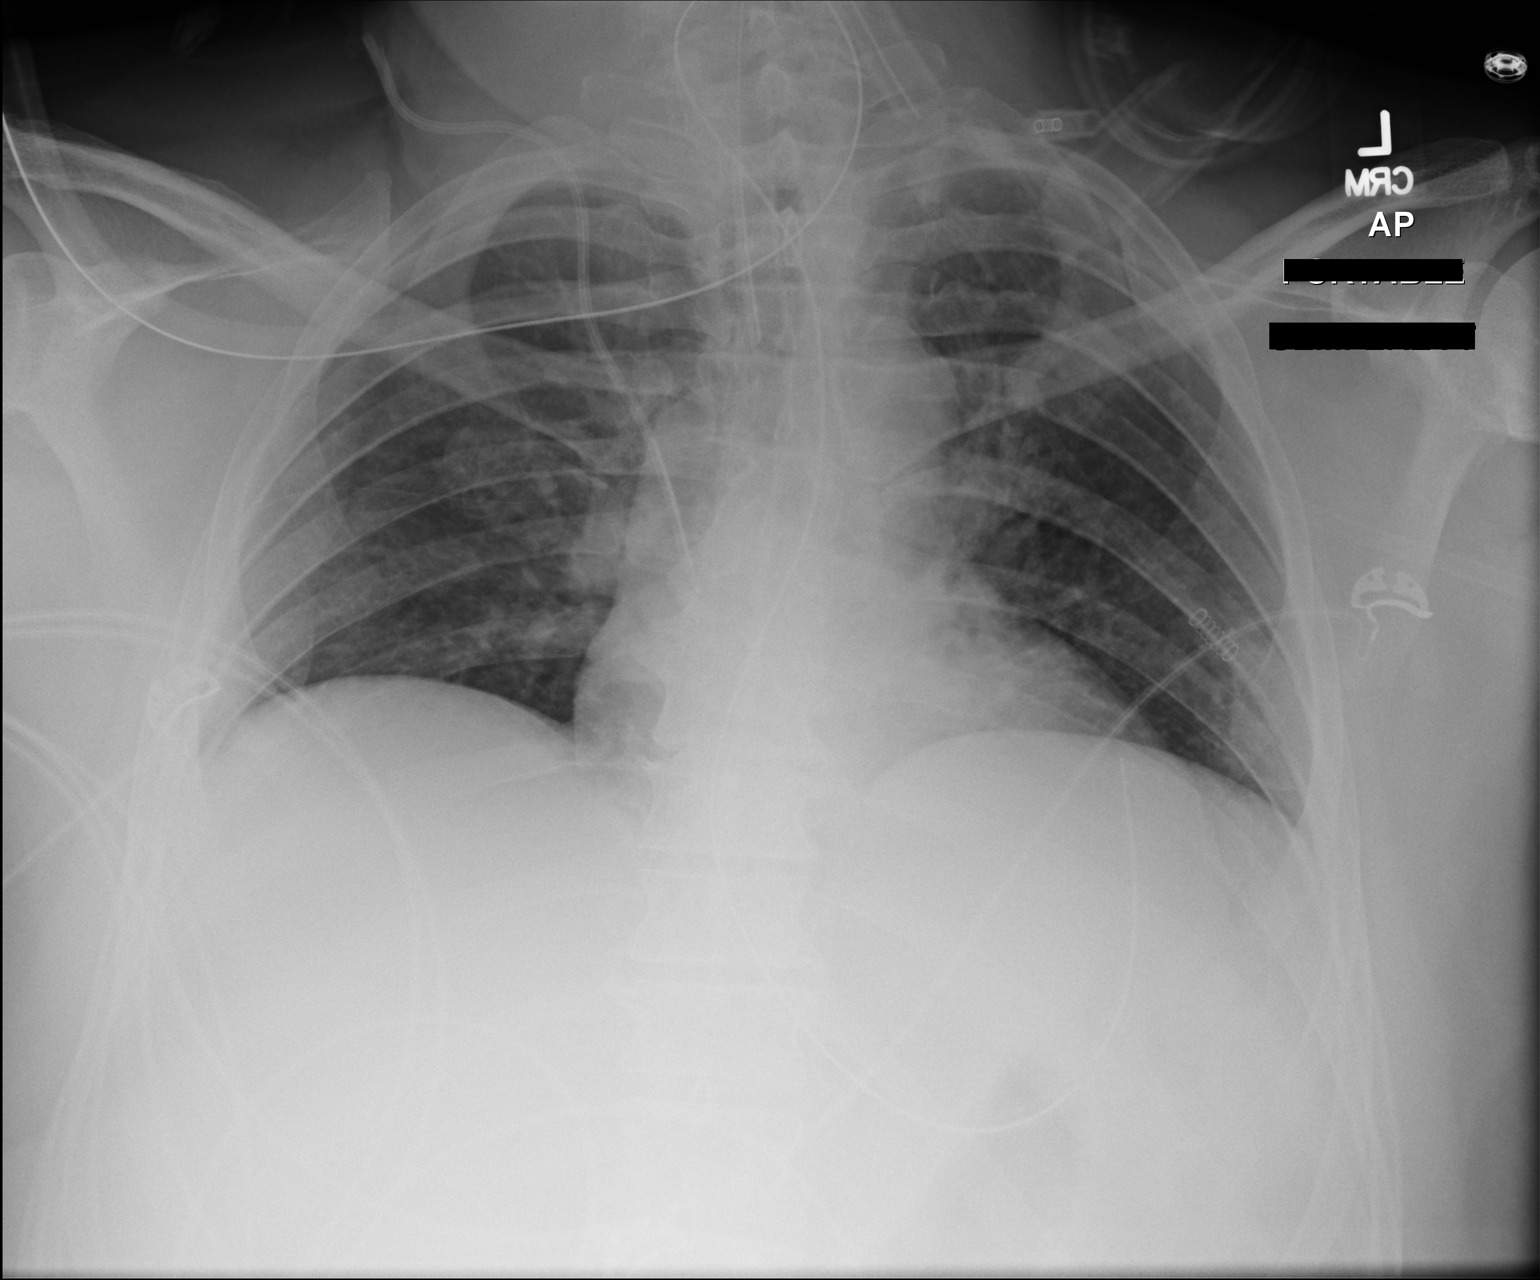

[1 of 1 positions shown; findings below may reference images not displayed]

FINDINGS: Endotracheal tube ends in the mid thoracic trachea. Right IJ
catheter at the level of the upper cavoatrial junction. Orogastric
tube is coiled in to the stomach, tip residing in the fundus.

Low volume lungs, likely accounting for perihilar opacities. No
edema or effusion. No pneumothorax.
IMPRESSION: 1. Tubes and lines are in good position, as above.
2. Low volume lungs with perihilar opacities presumably representing
atelectasis.

## 2015-07-19 IMAGING — CR DG CHEST 1V PORT
1 series · 1 of 1 positions shown · non-contrast
Comparison: 11/27/2013

CLINICAL DATA: Respiratory failure.

EXAM:
PORTABLE CHEST - 1 VIEW

[AP]
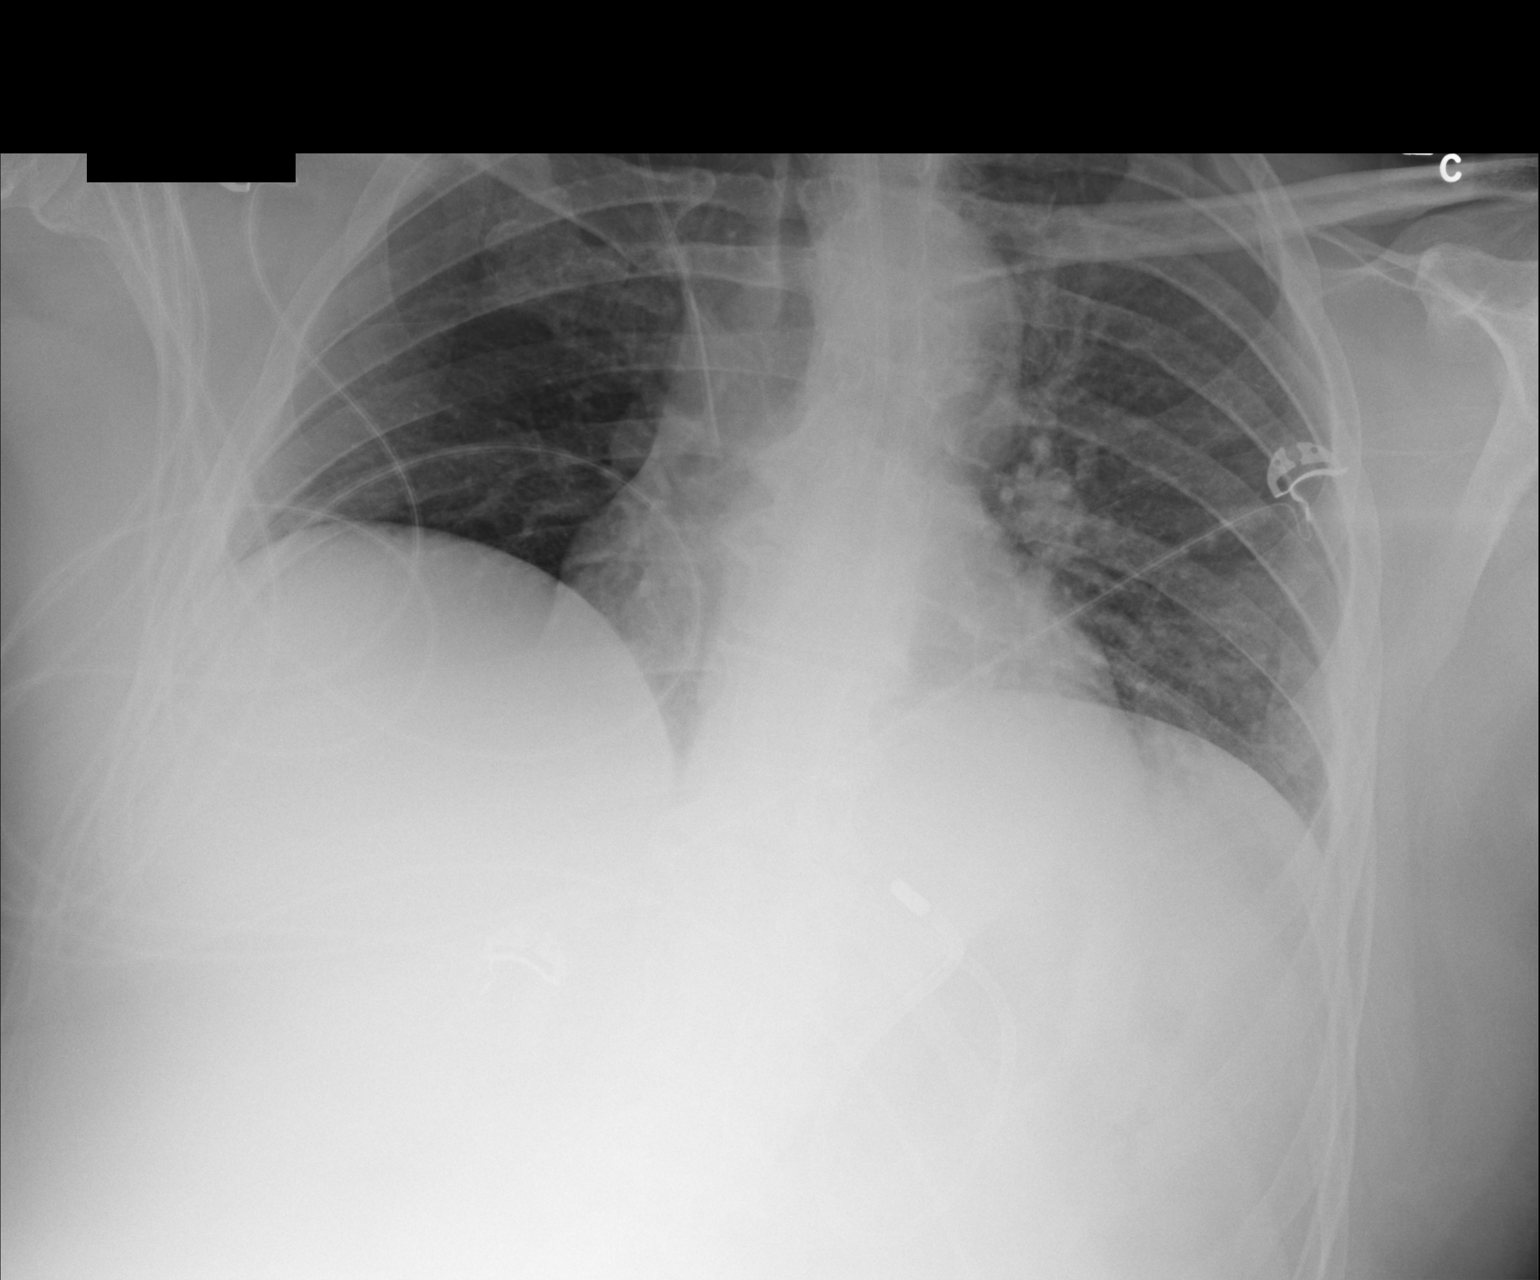

[1 of 1 positions shown; findings below may reference images not displayed]

FINDINGS: Feeding tube is in the body of the stomach with the tip in the
fundus. Central line is in good position. Endotracheal tube has been
removed.

Heart size and pulmonary vascularity are normal and the lungs are
clear.
IMPRESSION: No acute abnormality. Interval removal of the endotracheal tube and
replacement of the NG tube with a feeding tube. Feeding tube tip is
in the fundus of the stomach.

## 2015-07-20 IMAGING — CR DG CHEST 1V PORT
1 series · 1 of 1 positions shown · non-contrast
Comparison: Yesterday

CLINICAL DATA: Infiltrates

EXAM:
PORTABLE CHEST - 1 VIEW

[AP]
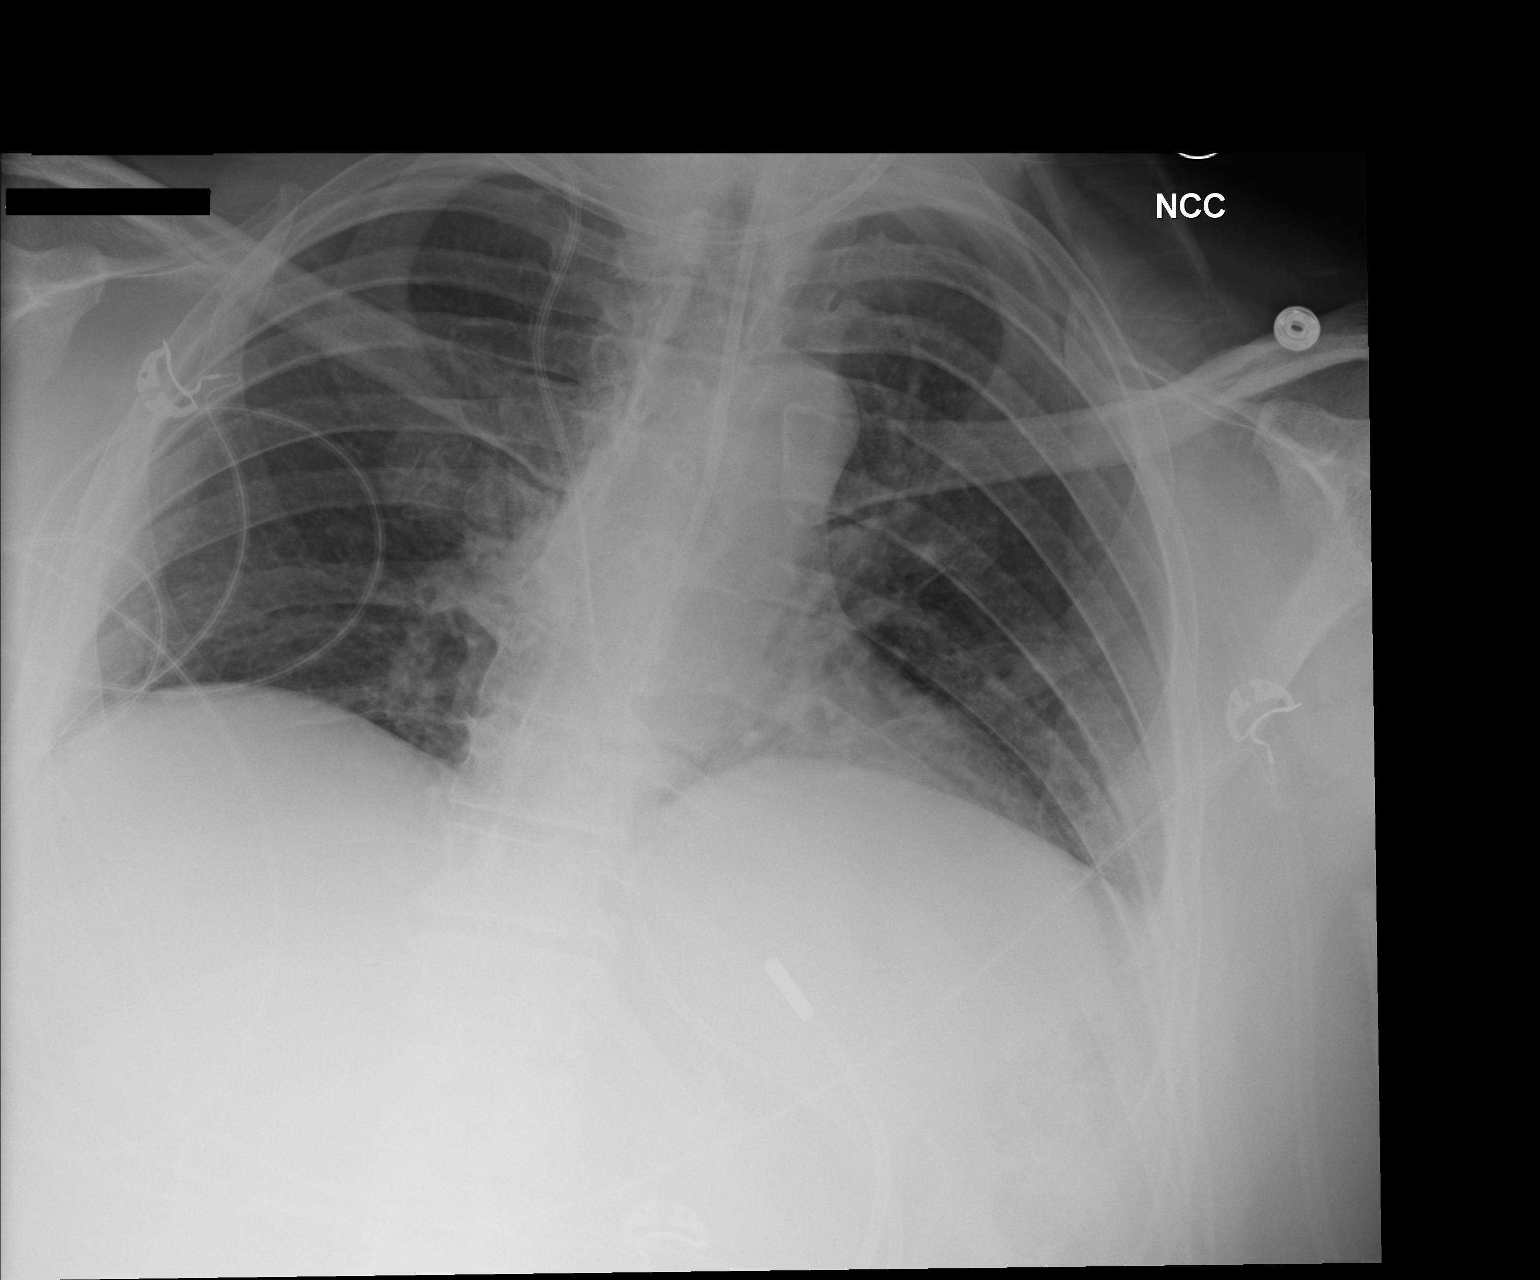

[1 of 1 positions shown; findings below may reference images not displayed]

FINDINGS: Central venous catheter stable. Stable feeding tube tip in the
fundus of the stomach. Low volumes. Stable left basilar atelectasis.
No pneumothorax.
IMPRESSION: Stable left base atelectasis.

## 2015-07-30 IMAGING — CR DG CHEST 1V PORT
1 series · 1 of 1 positions shown · non-contrast
Comparison: 12/17/2013.

CLINICAL DATA: Intubation.

EXAM:
PORTABLE CHEST - 1 VIEW

[AP]
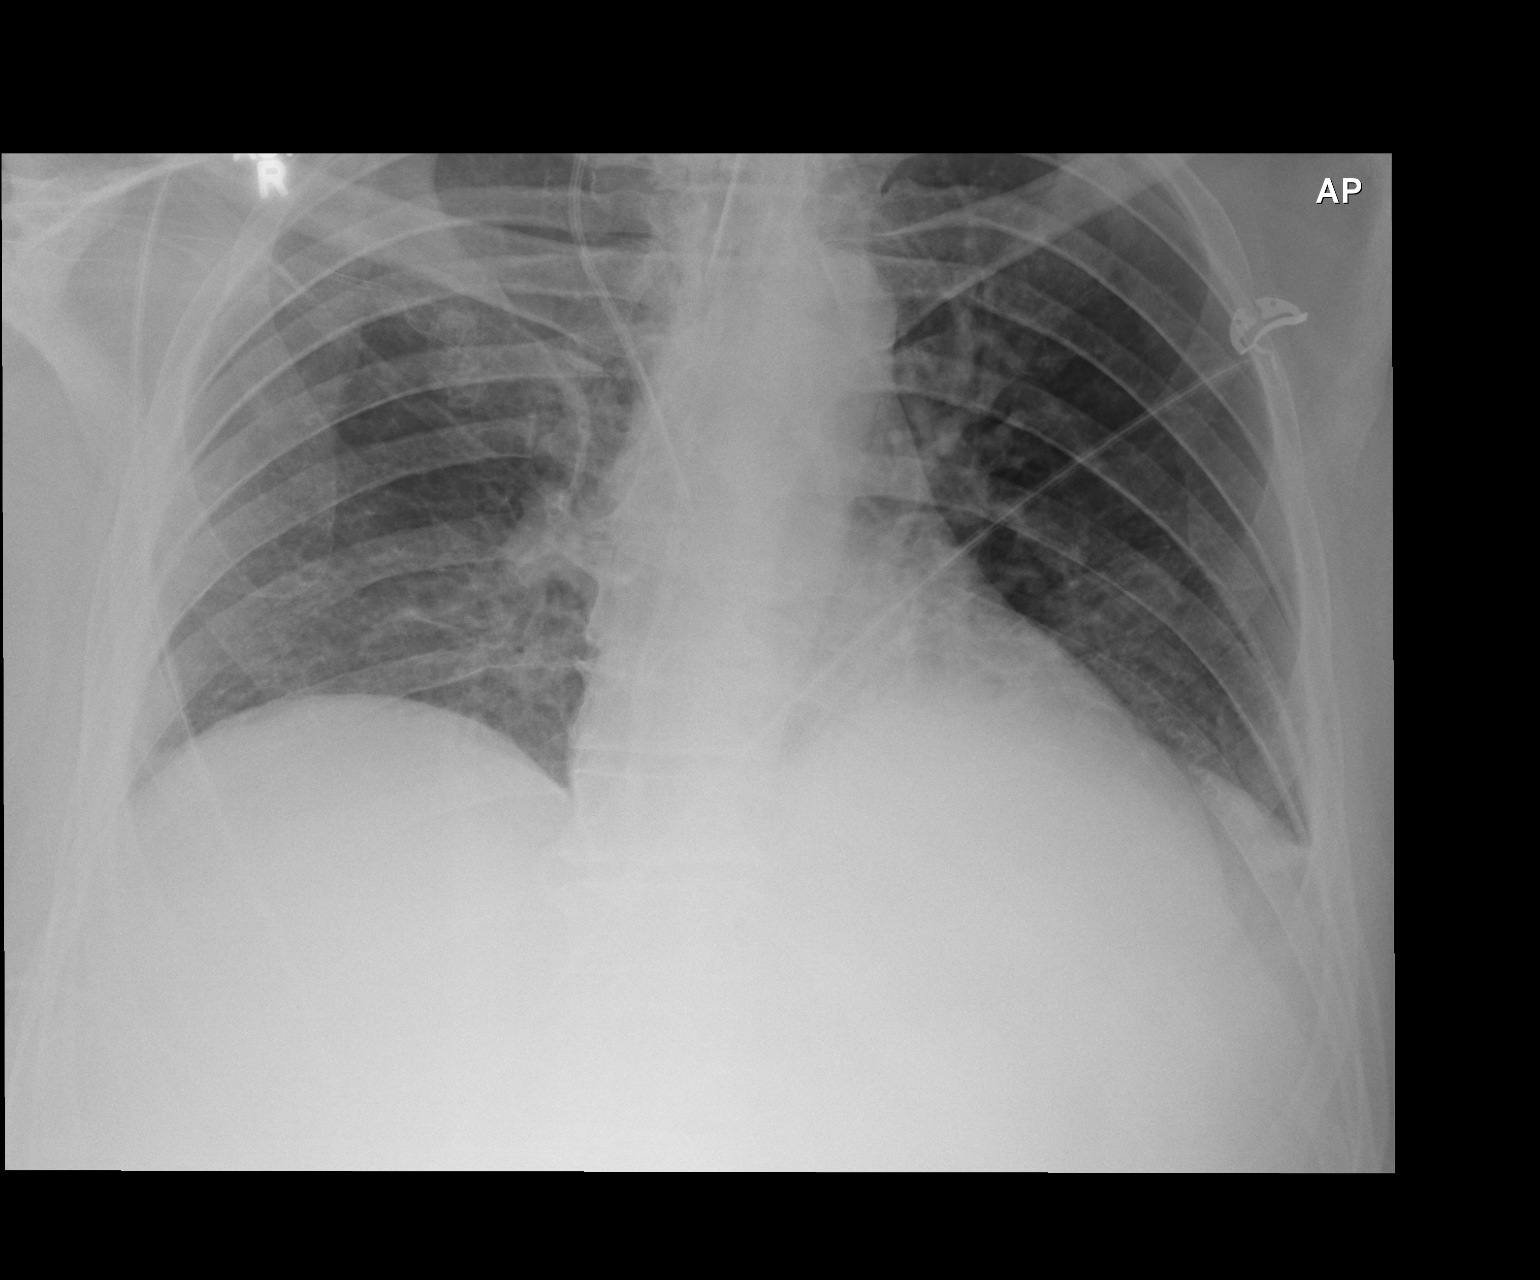

[1 of 1 positions shown; findings below may reference images not displayed]

FINDINGS: . Endotracheal tube and right IJ line in stable position. Previously
identified left lower lobe atelectasis and/or infiltrate has
partially cleared. No new infiltrates. Tiny left pleural effusion
cannot be excluded. No pneumothorax. Stable borderline cardiomegaly.
No pulmonary venous congestion. No pneumothorax. No acute osseus
abnormality.
IMPRESSION: 1. Stable line and tube positions
2. Partial clearing of left lower lobe atelectasis and/or
infiltrate. Tiny left pleural effusion is present.
